# Patient Record
Sex: Female | Born: 1955
Health system: Southern US, Community
[De-identification: ages and names within clinical notes are randomized; demographics above are authoritative.]

## PROBLEM LIST (undated history)

## (undated) DIAGNOSIS — I10 Essential (primary) hypertension: Secondary | ICD-10-CM

## (undated) DIAGNOSIS — J45909 Unspecified asthma, uncomplicated: Secondary | ICD-10-CM

## (undated) DIAGNOSIS — D219 Benign neoplasm of connective and other soft tissue, unspecified: Secondary | ICD-10-CM

## (undated) DIAGNOSIS — E119 Type 2 diabetes mellitus without complications: Secondary | ICD-10-CM

## (undated) HISTORY — DX: Essential (primary) hypertension: I10

## (undated) HISTORY — DX: Benign neoplasm of connective and other soft tissue, unspecified: D21.9

## (undated) HISTORY — DX: Type 2 diabetes mellitus without complications: E11.9

## (undated) HISTORY — DX: Unspecified asthma, uncomplicated: J45.909

## (undated) HISTORY — PX: MYOMECTOMY: SHX85

---

## 2000-03-13 ENCOUNTER — Other Ambulatory Visit: Admission: RE | Admit: 2000-03-13 | Discharge: 2000-03-13 | Payer: Self-pay | Admitting: Obstetrics and Gynecology

## 2001-03-13 ENCOUNTER — Other Ambulatory Visit: Admission: RE | Admit: 2001-03-13 | Discharge: 2001-03-13 | Payer: Self-pay | Admitting: Obstetrics and Gynecology

## 2002-05-13 ENCOUNTER — Other Ambulatory Visit: Admission: RE | Admit: 2002-05-13 | Discharge: 2002-05-13 | Payer: Self-pay | Admitting: Obstetrics and Gynecology

## 2003-05-21 ENCOUNTER — Other Ambulatory Visit: Admission: RE | Admit: 2003-05-21 | Discharge: 2003-05-21 | Payer: Self-pay | Admitting: Obstetrics and Gynecology

## 2004-05-19 ENCOUNTER — Other Ambulatory Visit: Admission: RE | Admit: 2004-05-19 | Discharge: 2004-05-19 | Payer: Self-pay | Admitting: Obstetrics and Gynecology

## 2005-05-23 ENCOUNTER — Other Ambulatory Visit: Admission: RE | Admit: 2005-05-23 | Discharge: 2005-05-23 | Payer: Self-pay | Admitting: Obstetrics and Gynecology

## 2011-06-22 ENCOUNTER — Other Ambulatory Visit: Payer: 59

## 2011-06-22 ENCOUNTER — Encounter (INDEPENDENT_AMBULATORY_CARE_PROVIDER_SITE_OTHER): Payer: 59 | Admitting: Obstetrics and Gynecology

## 2011-06-22 DIAGNOSIS — D259 Leiomyoma of uterus, unspecified: Secondary | ICD-10-CM

## 2011-06-22 DIAGNOSIS — N949 Unspecified condition associated with female genital organs and menstrual cycle: Secondary | ICD-10-CM

## 2012-05-06 ENCOUNTER — Ambulatory Visit (INDEPENDENT_AMBULATORY_CARE_PROVIDER_SITE_OTHER): Payer: BC Managed Care – PPO | Admitting: Obstetrics and Gynecology

## 2012-05-06 ENCOUNTER — Encounter: Payer: Self-pay | Admitting: Obstetrics and Gynecology

## 2012-05-06 VITALS — BP 120/78 | Resp 16 | Ht 66.0 in | Wt 291.0 lb

## 2012-05-06 DIAGNOSIS — Z331 Pregnant state, incidental: Secondary | ICD-10-CM

## 2012-05-06 DIAGNOSIS — Z124 Encounter for screening for malignant neoplasm of cervix: Secondary | ICD-10-CM

## 2012-05-06 NOTE — Progress Notes (Signed)
Subjective:    Morgan Vincent is a 57 y.o. female No obstetric history on file. who presents for annual exam. The patient planes of urinary urgency and worsening incontinence.  The following portions of the patient's history were reviewed and updated as appropriate: allergies, current medications, past family history, past medical history, past social history, past surgical history and problem list.  Review of Systems Pertinent items are noted in HPI. Gastrointestinal:No change in bowel habits, no abdominal pain, no rectal bleeding Genitourinary:negative for dysuria, frequency, hematuria, nocturia and urinary incontinence    Objective:     BP 120/78  Resp 16  Ht 5\' 6"  (1.676 m)  Wt 291 lb (131.997 kg)  BMI 46.97 kg/m2  Weight:  Wt Readings from Last 1 Encounters:  05/06/12 291 lb (131.997 kg)     BMI: Body mass index is 46.97 kg/(m^2). General Appearance: Alert, appropriate appearance for age. No acute distress HEENT: Grossly normal Neck / Thyroid: Supple, no masses, nodes or enlargement Lungs: clear to auscultation bilaterally Back: No CVA tenderness Breast Exam: No masses or nodes.No dimpling, nipple retraction or discharge. Cardiovascular: Regular rate and rhythm. S1, S2, no murmur Gastrointestinal: Soft, non-tender, no masses or organomegaly  ++++++++++++++++++++++++++++++++++++++++++++++++++++++++  Pelvic Exam: External genitalia: normal general appearance Vaginal: normal without tenderness, induration or masses Cervix: nontender Adnexa: normal bimanual exam Uterus: seems normal but difficult to outline Rectovaginal: normal rectal, no masses  ++++++++++++++++++++++++++++++++++++++++++++++++++++++++  Lymphatic Exam: Non-palpable nodes in neck, clavicular, axillary, or inguinal regions  Psychiatric: Alert and oriented, appropriate affect.      Assessment:    Normal gyn exam   Overweight or obese: Yes  Pelvic relaxation: No  Menopausal symptoms: No. Severe:  No.  Urinary incontinence   Plan:    Mammogram. Pap smear.   Follow-up:  for annual exam  The updated Pap smear screening guidelines were discussed with the patient. The patient requested that I obtain a Pap smear: Yes.  Kegel exercises discussed: Yes.  Proper diet and regular exercise were reviewed.  Annual mammograms recommended starting at age 93. Proper breast care was discussed.  Screening colonoscopy is recommended beginning at age 75.  Regular health maintenance was reviewed.  Sleep hygiene was discussed.  Adequate calcium and vitamin D intake was emphasized.  Leonard Schwartz M.D.   Regular Periods: no Mammogram: no  Monthly Breast Ex.: no Exercise: no  Tetanus < 10 years: no Seatbelts: yes  NI. Bladder Functn.: no "Frequency"  Abuse at home: no  Daily BM's: yes Stressful Work: yes  Healthy Diet: no Sigmoid-Colonoscopy: 2007  Calcium: yes Medical problems this year: Hard Stools.    LAST PAP:05/24/2011 "WNL" pt has new INS.   Contraception: None  Mammogram:  06/13/2011 "WNL"  PCP: Dr. Dorena Cookey  PMH: No changes  FMH: No Changes  Last Bone Scan: WNL pt unsure when

## 2012-05-07 LAB — PAP IG W/ RFLX HPV ASCU

## 2017-09-27 ENCOUNTER — Encounter: Payer: Self-pay | Admitting: Gynecologic Oncology

## 2017-10-03 ENCOUNTER — Encounter: Payer: Self-pay | Admitting: Gynecologic Oncology

## 2017-10-03 ENCOUNTER — Telehealth: Payer: Self-pay | Admitting: *Deleted

## 2017-10-03 ENCOUNTER — Inpatient Hospital Stay: Payer: Managed Care, Other (non HMO) | Attending: Gynecologic Oncology | Admitting: Gynecologic Oncology

## 2017-10-03 VITALS — BP 143/56 | HR 99 | Temp 98.3°F | Resp 18 | Ht 65.5 in | Wt 269.4 lb

## 2017-10-03 DIAGNOSIS — N9489 Other specified conditions associated with female genital organs and menstrual cycle: Secondary | ICD-10-CM

## 2017-10-03 DIAGNOSIS — C541 Malignant neoplasm of endometrium: Secondary | ICD-10-CM | POA: Diagnosis not present

## 2017-10-03 MED ORDER — MEGESTROL ACETATE 40 MG PO TABS: 40.0000 mg | ORAL_TABLET | Freq: Two times a day (BID) | ORAL | 0 refills | Status: DC

## 2017-10-03 NOTE — Patient Instructions (Addendum)
Preparing for your Surgery  Plan for surgery on October 15, 2017 with Dr. Everitt Amber at [place] will be scheduled for a robotic assisted total hysterectomy, bilateral salpingo-oophorectomy, sentinel lymph node biopsy, mini laparotomy.  STOP TAKING ASPIRIN NOW  Begin taking Megace 40 mg twice daily.  Pre-operative Testing -You will receive a phone call from presurgical testing at [facility] to arrange for a pre-operative testing appointment before your surgery.  This appointment normally occurs one to two weeks before your scheduled surgery.   -Bring your insurance card, copy of an advanced directive if applicable, medication list  -At that visit, you will be asked to sign a consent for a possible blood transfusion in case a transfusion becomes necessary during surgery.  The need for a blood transfusion is rare but having consent is a necessary part of your care.     -You should not be taking blood thinners or aspirin at least ten days prior to surgery unless instructed by your surgeon.  Day Before Surgery at [place] will be asked to take in a light diet the day before surgery.  Avoid carbonated beverages.  You will be advised to have nothing to eat or drink after midnight the evening before.    Eat a light diet the day before surgery.  Examples including soups, broths, toast, yogurt, mashed potatoes.  Things to avoid include carbonated beverages (fizzy beverages), raw fruits and raw vegetables, or beans.   If your bowels are filled with gas, your surgeon will have difficulty visualizing your pelvic organs which increases your surgical risks.  Your role in recovery Your role is to become active as soon as directed by your doctor, while still giving yourself time to heal.  Rest when you feel tired. You will be asked to do the following in order to speed your recovery:  - Cough and breathe deeply. This helps toclear and expand your lungs and can  prevent pneumonia. You may be given a spirometer to practice deep breathing. A staff member will show you how to use the spirometer. - Do mild physical activity. Walking or moving your legs help your circulation and body functions return to normal. A staff member will help you when you try to walk and will provide you with simple exercises. Do not try to get up or walk alone the first time. - Actively manage your pain. Managing your pain lets you move in comfort. We will ask you to rate your pain on a scale of zero to 10. It is your responsibility to tell your doctor or nurse where and how much you hurt so your pain can be treated.  Special Considerations -If you are diabetic, you may be placed on insulin after surgery to have closer control over your blood sugars to promote healing and recovery.  This does not mean that you will be discharged on insulin.  If applicable, your oral antidiabetics will be resumed when you are tolerating a solid diet.  -Your final pathology results from surgery should be available by the Friday after surgery and the results will be relayed to you when available.  -Dr. Lahoma Crocker is the Surgeon that assists your GYN Oncologist with surgery.  The next day after your surgery you will either see your GYN Oncologist or Dr. Lahoma Crocker.   Blood Transfusion Information WHAT IS A BLOOD TRANSFUSION? A transfusion is the replacement of blood or some of its parts. Blood is made up of multiple cells  which provide different functions.  Red blood cells carry oxygen and are used for blood loss replacement.  White blood cells fight against infection.  Platelets control bleeding.  Plasma helps clot blood.  Other blood products are available for specialized needs, such as hemophilia or other clotting disorders. BEFORE THE TRANSFUSION  Who gives blood for transfusions?   You may be able to donate blood to be used at a later date on yourself (autologous  donation).  Relatives can be asked to donate blood. This is generally not any safer than if you have received blood from a stranger. The same precautions are taken to ensure safety when a relative's blood is donated.  Healthy volunteers who are fully evaluated to make sure their blood is safe. This is blood bank blood. Transfusion therapy is the safest it has ever been in the practice of medicine. Before blood is taken from a donor, a complete history is taken to make sure that person has no history of diseases nor engages in risky social behavior (examples are intravenous drug use or sexual activity with multiple partners). The donor's travel history is screened to minimize risk of transmitting infections, such as malaria. The donated blood is tested for signs of infectious diseases, such as HIV and hepatitis. The blood is then tested to be sure it is compatible with you in order to minimize the chance of a transfusion reaction. If you or a relative donates blood, this is often done in anticipation of surgery and is not appropriate for emergency situations. It takes many days to process the donated blood. RISKS AND COMPLICATIONS Although transfusion therapy is very safe and saves many lives, the main dangers of transfusion include:   Getting an infectious disease.  Developing a transfusion reaction. This is an allergic reaction to something in the blood you were given. Every precaution is taken to prevent this. The decision to have a blood transfusion has been considered carefully by your caregiver before blood is given. Blood is not given unless the benefits outweigh the risks.

## 2017-10-03 NOTE — Telephone Encounter (Signed)
Faxed medical request/release form to [facility] for the CT scan to be deliver via [employer]

## 2017-10-03 NOTE — H&P (View-Only) (Signed)
Consult Note: Gyn-Onc  Consult was requested by Dr. Raphael Gibney for the evaluation of Morgan Vincent 62 y.o. female  CC:  Chief Complaint  Patient presents with  . Endometrial mass  . endometrial cancer    Assessment/Plan:  Ms. ESTEFANIE CORNFORTH  is a 62 y.o.  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   A detailed discussion was held with the patient and her family with regard to to her endometrial cancer diagnosis. We discussed the standard management options for uterine cancer which includes surgery followed possibly by adjuvant therapy depending on the results of surgery. The options for surgical management include a hysterectomy and removal of the tubes and ovaries possibly with removal of pelvic and para-aortic lymph nodes.If feasible, a minimally invasive approach including a robotic hysterectomy or laparoscopic hysterectomy have benefits including shorter hospital stay, recovery time and better wound healing than with open surgery. The patient has been counseled about these surgical options and the risks of surgery in general including infection, bleeding, damage to surrounding structures (including bowel, bladder, ureters, nerves or vessels), and the postoperative risks of PE/ DVT, and lymphedema. I extensively reviewed the additional risks of robotic hysterectomy including possible need for conversion to open laparotomy.  I discussed positioning during surgery of trendelenberg and risks of minor facial swelling and care we take in preoperative positioning.  After counseling and consideration of her options, she desires to proceed with robotic assisted total hysterectomy with bilateral sapingo-oophorectomy and SLN biopsy.   She will be seen by anesthesia for preoperative clearance and discussion of postoperative pain management.  She was given the opportunity to ask questions, which were answered to her satisfaction, and she is agreement with the  above mentioned plan of care.  I discussed that due to the apparent high grade nature of her lesion, postop adjuvant therapy will likely be recommended.   We will obtain the CT images from [facility] to better evaluate the size of the uterus. If the uterus is enlarged, minilaparotomy for specimen delivery may be necessary.   She requested treatment for the bleeding (we will give megace 40BID until surgery). Will check HbA1c preop.    HPI: Morgan Vincent is a 62 year old woman who is seen in consultation at the request of Dr Raphael Gibney for a high grade endometrial cancer.  Patient developed severe abdominal pains in the abdomen on Sep 18, 2017.  This became worse overnight and she presented to [facility] emergency room department on Sep 19, 2017.  Her pains were crampy and throughout the upper and lower abdomen.  A CT scan of the abdomen and pelvis was performed which revealed mild inflammation involving distal small bowel loops including the terminal ileum with no obstruction, diverticulosis without definite diverticulitis.  No abscess.  The uterus was markedly abnormal with large heterogeneous soft tissues within the endometrial cavity.  There are multiple calcified fibroids identified.  She was presumed to have a diagnosis of gastroenteritis and was discharged home with recommendation to follow-up with her OB/GYN provider for work-up of her abnormal uterine findings.  She saw Dr. Raphael Gibney on May 31st, 2019 and a Pap smear was obtained which revealed atypical glandular cells, HPV negative.  An endometrial biopsy was performed which revealed minute fragments of malignant neoplasm, favor high-grade carcinoma.  There is small amount of of tissue was too small for further evaluation with immunohistochemistry.  I recommendation to correlate with clinical findings is suggested.  The patient is obese, she has a history of hypertension, and asthma.  She has had a prior laparoscopic fibroid surgery (possible  myomectomy).  She is nulliparous.  She had no other abdominal surgeries.  Her last menstrual period was in her 70s and she has had no postmenopausal bleeding until she had a biopsy which diagnosed her endometrial cancer.  Her family history significant for father who died of liver cancer, a paternal uncle who died of cancer of unknown origin, and a maternal aunt who died of cancer of unknown origin.  The patient has been diagnosed with diabetes, however she is not treated with medication for this.  Her last colonoscopy was 2 3 years ago.  She had a normal Pap smear in 2017.  Current Meds:  Outpatient Encounter Medications as of 10/03/2017  Medication Sig  . ALBUTEROL IN Inhale into the lungs. As needed  . ASCORBIC ACID PO Take by mouth.  . Cholecalciferol (VITAMIN D3) 1000 units CAPS Take by mouth.  . dicyclomine (BENTYL) 20 MG tablet 1 tab every 6 hours for 3 days.  . hydrochlorothiazide (HYDRODIURIL) 50 MG tablet hydrochlorothiazide 50 mg tablet  . potassium chloride (MICRO-K) 10 MEQ CR capsule Take by mouth.  Marland Kitchen PROBIOTIC PRODUCT PO Take by mouth.  Marland Kitchen aspirin 81 MG tablet Take 81 mg by mouth daily.  . megestrol (MEGACE) 40 MG tablet Take 1 tablet (40 mg total) by mouth 2 (two) times daily.   No facility-administered encounter medications on file as of 10/03/2017.     Allergy: No Known Allergies  Social Hx:   Social History   Socioeconomic History  . Marital status: Single    Spouse name: Not on file  . Number of children: Not on file  . Years of education: Not on file  . Highest education level: Not on file  Occupational History  . Not on file  Social Needs  . Financial resource strain: Not on file  . Food insecurity:    Worry: Not on file    Inability: Not on file  . Transportation needs:    Medical: Not on file    Non-medical: Not on file  Tobacco Use  . Smoking status: Never Smoker  . Smokeless tobacco: Never Used  Substance and Sexual Activity  . Alcohol use: No  .  Drug use: No  . Sexual activity: Not Currently    Partners: Male    Birth control/protection: None  Lifestyle  . Physical activity:    Days per week: Not on file    Minutes per session: Not on file  . Stress: Not on file  Relationships  . Social connections:    Talks on phone: Not on file    Gets together: Not on file    Attends religious service: Not on file    Active member of club or organization: Not on file    Attends meetings of clubs or organizations: Not on file    Relationship status: Not on file  . Intimate partner violence:    Fear of current or ex partner: Not on file    Emotionally abused: Not on file    Physically abused: Not on file    Forced sexual activity: Not on file  Other Topics Concern  . Not on file  Social History Narrative  . Not on file    Past Surgical Hx: History reviewed. No pertinent surgical history.  Past Medical Hx:  Past Medical History:  Diagnosis Date  . Asthma   .  Diabetes mellitus without complication ([place])   . Fibroids   . Hypertension     Past Gynecological History:  No history of abnormal paps (last normal pap was 2017). G0, history of fibroids. No LMP recorded. Patient is postmenopausal.  Family Hx:  Family History  Problem Relation Age of Onset  . Alzheimer's disease Mother   . Liver cancer Father   . Cancer Maternal Aunt     Review of Systems:  Constitutional  Feels well,    ENT Normal appearing ears and nares bilaterally Skin/Breast  No rash, sores, jaundice, itching, dryness Cardiovascular  No chest pain, shortness of breath, or edema  Pulmonary  No cough or wheeze.  Gastro Intestinal  No nausea, vomitting, or diarrhoea. No bright red blood per rectum, no abdominal pain, change in bowel movement, or constipation.  Genito Urinary  No frequency, urgency, dysuria, + postmenopausal bleeding Musculo Skeletal  No myalgia, arthralgia, joint swelling or pain  Neurologic  No weakness, numbness, change in gait,   Psychology  No depression, anxiety, insomnia.   Vitals:  Blood pressure (!) 143/56, pulse 99, temperature 98.3 F (36.8 C), temperature source Oral, resp. rate 18, height 5' 5.5" (1.664 m), weight 269 lb 6 oz (122.2 kg), SpO2 100 %.  Physical Exam: WD in NAD Neck  Supple NROM, without any enlargements.  Lymph Node Survey No cervical supraclavicular or inguinal adenopathy Cardiovascular  Pulse normal rate, regularity and rhythm. S1 and S2 normal.  Lungs  Clear to auscultation bilateraly, without wheezes/crackles/rhonchi. Good air movement.  Skin  No rash/lesions/breakdown  Psychiatry  Alert and oriented to person, place, and time  Abdomen  Normoactive bowel sounds, abdomen soft, non-tender and obese without evidence of hernia.  Back No CVA tenderness Genito Urinary  Vulva/vagina: Normal external female genitalia.   No lesions. No discharge or bleeding.  Bladder/urethra:  No lesions or masses, well supported bladder  Vagina: normal, blood in vagina  Cervix: Normal appearing, no lesions.  Uterus: 10+ weeks, irregular, mobile, no parametrial involvement or nodularity.  Adnexa: no palpable masses. Rectal  deferred  Extremities  No bilateral cyanosis, clubbing or edema.   Thereasa Solo, MD  10/03/2017, 5:08 PM

## 2017-10-03 NOTE — Progress Notes (Signed)
Consult Note: Gyn-Onc  Consult was requested by Dr. Raphael Gibney for the evaluation of Morgan Vincent 62 y.o. female  CC:  Chief Complaint  Patient presents with  . Endometrial mass  . endometrial cancer    Assessment/Plan:  Ms. SETAREH ROM  is a 62 y.o.  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   A detailed discussion was held with the patient and her family with regard to to her endometrial cancer diagnosis. We discussed the standard management options for uterine cancer which includes surgery followed possibly by adjuvant therapy depending on the results of surgery. The options for surgical management include a hysterectomy and removal of the tubes and ovaries possibly with removal of pelvic and para-aortic lymph nodes.If feasible, a minimally invasive approach including a robotic hysterectomy or laparoscopic hysterectomy have benefits including shorter hospital stay, recovery time and better wound healing than with open surgery. The patient has been counseled about these surgical options and the risks of surgery in general including infection, bleeding, damage to surrounding structures (including bowel, bladder, ureters, nerves or vessels), and the postoperative risks of PE/ DVT, and lymphedema. I extensively reviewed the additional risks of robotic hysterectomy including possible need for conversion to open laparotomy.  I discussed positioning during surgery of trendelenberg and risks of minor facial swelling and care we take in preoperative positioning.  After counseling and consideration of her options, she desires to proceed with robotic assisted total hysterectomy with bilateral sapingo-oophorectomy and SLN biopsy.   She will be seen by anesthesia for preoperative clearance and discussion of postoperative pain management.  She was given the opportunity to ask questions, which were answered to her satisfaction, and she is agreement with the  above mentioned plan of care.  I discussed that due to the apparent high grade nature of her lesion, postop adjuvant therapy will likely be recommended.   We will obtain the CT images from [facility] to better evaluate the size of the uterus. If the uterus is enlarged, minilaparotomy for specimen delivery may be necessary.   She requested treatment for the bleeding (we will give megace 40BID until surgery). Will check HbA1c preop.    HPI: Morgan Vincent is a 62 year old woman who is seen in consultation at the request of Dr Raphael Gibney for a high grade endometrial cancer.  Patient developed severe abdominal pains in the abdomen on Sep 18, 2017.  This became worse overnight and she presented to [facility] emergency room department on Sep 19, 2017.  Her pains were crampy and throughout the upper and lower abdomen.  A CT scan of the abdomen and pelvis was performed which revealed mild inflammation involving distal small bowel loops including the terminal ileum with no obstruction, diverticulosis without definite diverticulitis.  No abscess.  The uterus was markedly abnormal with large heterogeneous soft tissues within the endometrial cavity.  There are multiple calcified fibroids identified.  She was presumed to have a diagnosis of gastroenteritis and was discharged home with recommendation to follow-up with her OB/GYN provider for work-up of her abnormal uterine findings.  She saw Dr. Raphael Gibney on May 31st, 2019 and a Pap smear was obtained which revealed atypical glandular cells, HPV negative.  An endometrial biopsy was performed which revealed minute fragments of malignant neoplasm, favor high-grade carcinoma.  There is small amount of of tissue was too small for further evaluation with immunohistochemistry.  I recommendation to correlate with clinical findings is suggested.  The patient is obese, she has a history of hypertension, and asthma.  She has had a prior laparoscopic fibroid surgery (possible  myomectomy).  She is nulliparous.  She had no other abdominal surgeries.  Her last menstrual period was in her 38s and she has had no postmenopausal bleeding until she had a biopsy which diagnosed her endometrial cancer.  Her family history significant for father who died of liver cancer, a paternal uncle who died of cancer of unknown origin, and a maternal aunt who died of cancer of unknown origin.  The patient has been diagnosed with diabetes, however she is not treated with medication for this.  Her last colonoscopy was 2 3 years ago.  She had a normal Pap smear in 2017.  Current Meds:  Outpatient Encounter Medications as of 10/03/2017  Medication Sig  . ALBUTEROL IN Inhale into the lungs. As needed  . ASCORBIC ACID PO Take by mouth.  . Cholecalciferol (VITAMIN D3) 1000 units CAPS Take by mouth.  . dicyclomine (BENTYL) 20 MG tablet 1 tab every 6 hours for 3 days.  . hydrochlorothiazide (HYDRODIURIL) 50 MG tablet hydrochlorothiazide 50 mg tablet  . potassium chloride (MICRO-K) 10 MEQ CR capsule Take by mouth.  Marland Kitchen PROBIOTIC PRODUCT PO Take by mouth.  Marland Kitchen aspirin 81 MG tablet Take 81 mg by mouth daily.  . megestrol (MEGACE) 40 MG tablet Take 1 tablet (40 mg total) by mouth 2 (two) times daily.   No facility-administered encounter medications on file as of 10/03/2017.     Allergy: No Known Allergies  Social Hx:   Social History   Socioeconomic History  . Marital status: Single    Spouse name: Not on file  . Number of children: Not on file  . Years of education: Not on file  . Highest education level: Not on file  Occupational History  . Not on file  Social Needs  . Financial resource strain: Not on file  . Food insecurity:    Worry: Not on file    Inability: Not on file  . Transportation needs:    Medical: Not on file    Non-medical: Not on file  Tobacco Use  . Smoking status: Never Smoker  . Smokeless tobacco: Never Used  Substance and Sexual Activity  . Alcohol use: No  .  Drug use: No  . Sexual activity: Not Currently    Partners: Male    Birth control/protection: None  Lifestyle  . Physical activity:    Days per week: Not on file    Minutes per session: Not on file  . Stress: Not on file  Relationships  . Social connections:    Talks on phone: Not on file    Gets together: Not on file    Attends religious service: Not on file    Active member of club or organization: Not on file    Attends meetings of clubs or organizations: Not on file    Relationship status: Not on file  . Intimate partner violence:    Fear of current or ex partner: Not on file    Emotionally abused: Not on file    Physically abused: Not on file    Forced sexual activity: Not on file  Other Topics Concern  . Not on file  Social History Narrative  . Not on file    Past Surgical Hx: History reviewed. No pertinent surgical history.  Past Medical Hx:  Past Medical History:  Diagnosis Date  . Asthma   .  Diabetes mellitus without complication ([place])   . Fibroids   . Hypertension     Past Gynecological History:  No history of abnormal paps (last normal pap was 2017). G0, history of fibroids. No LMP recorded. Patient is postmenopausal.  Family Hx:  Family History  Problem Relation Age of Onset  . Alzheimer's disease Mother   . Liver cancer Father   . Cancer Maternal Aunt     Review of Systems:  Constitutional  Feels well,    ENT Normal appearing ears and nares bilaterally Skin/Breast  No rash, sores, jaundice, itching, dryness Cardiovascular  No chest pain, shortness of breath, or edema  Pulmonary  No cough or wheeze.  Gastro Intestinal  No nausea, vomitting, or diarrhoea. No bright red blood per rectum, no abdominal pain, change in bowel movement, or constipation.  Genito Urinary  No frequency, urgency, dysuria, + postmenopausal bleeding Musculo Skeletal  No myalgia, arthralgia, joint swelling or pain  Neurologic  No weakness, numbness, change in gait,   Psychology  No depression, anxiety, insomnia.   Vitals:  Blood pressure (!) 143/56, pulse 99, temperature 98.3 F (36.8 C), temperature source Oral, resp. rate 18, height 5' 5.5" (1.664 m), weight 269 lb 6 oz (122.2 kg), SpO2 100 %.  Physical Exam: WD in NAD Neck  Supple NROM, without any enlargements.  Lymph Node Survey No cervical supraclavicular or inguinal adenopathy Cardiovascular  Pulse normal rate, regularity and rhythm. S1 and S2 normal.  Lungs  Clear to auscultation bilateraly, without wheezes/crackles/rhonchi. Good air movement.  Skin  No rash/lesions/breakdown  Psychiatry  Alert and oriented to person, place, and time  Abdomen  Normoactive bowel sounds, abdomen soft, non-tender and obese without evidence of hernia.  Back No CVA tenderness Genito Urinary  Vulva/vagina: Normal external female genitalia.   No lesions. No discharge or bleeding.  Bladder/urethra:  No lesions or masses, well supported bladder  Vagina: normal, blood in vagina  Cervix: Normal appearing, no lesions.  Uterus: 10+ weeks, irregular, mobile, no parametrial involvement or nodularity.  Adnexa: no palpable masses. Rectal  deferred  Extremities  No bilateral cyanosis, clubbing or edema.   Thereasa Solo, MD  10/03/2017, 5:08 PM

## 2017-10-05 ENCOUNTER — Other Ambulatory Visit: Payer: Self-pay | Admitting: Gynecologic Oncology

## 2017-10-05 ENCOUNTER — Inpatient Hospital Stay
Admission: RE | Admit: 2017-10-05 | Discharge: 2017-10-05 | Disposition: A | Discharge status: Home / Self Care | Payer: Self-pay | Source: Ambulatory Visit | Attending: Gynecologic Oncology | Admitting: Gynecologic Oncology

## 2017-10-05 DIAGNOSIS — C801 Malignant (primary) neoplasm, unspecified: Secondary | ICD-10-CM

## 2017-10-09 NOTE — Patient Instructions (Addendum)
Morgan Vincent  10/09/2017   Your procedure is scheduled on: 10-15-17   Report to [facility] Main  Entrance    Report to Admitting at 11:30 AM   Eat a light diet the day before surgery.  Examples including soups, broths, toast, yogurt, mashed potatoes.  Things to avoid include carbonated beverages (fizzy beverages), raw fruits and raw vegetables, or beans.   If your bowels are filled with gas, your surgeon will have difficulty visualizing your pelvic organs which increases your surgical risks.    Call this number if you have problems the morning of surgery [phone]   Remember: Do not eat food or drink liquids :After Midnight.You may have a Clear Liquid Diet from Midnight until 8:00 AM. After 8:00 AM, nothing until after surgery.    CLEAR LIQUID DIET   Foods Allowed                                                                     Foods Excluded  Coffee and tea, regular and decaf                             liquids that you cannot  Plain Jell-O in any flavor                                             see through such as: Fruit ices (not with fruit pulp)                                     milk, soups, orange juice  Iced Popsicles                                    All solid food Carbonated beverages, regular and diet                                    Cranberry, grape and apple juices Sports drinks like Gatorade Lightly seasoned clear broth or consume(fat free) Sugar, honey syrup  Sample Menu Breakfast                                Lunch                                     Supper Cranberry juice                    Beef broth                            Chicken broth Jell-O  Grape juice                           Apple juice Coffee or tea                        Jell-O                                      Popsicle                                                Coffee or tea                        Coffee or  tea  _____________________________________________________________________     Take these medicines the morning of surgery with A SIP OF WATER: None                                 You may not have any metal on your body including hair pins and              piercings  Do not wear jewelry, make-up, lotions, powders or perfumes, deodorant             Do not wear nail polish.  Do not shave  48 hours prior to surgery.                Do not bring valuables to the hospital. [place].  Contacts, dentures or bridgework may not be worn into surgery.  Leave suitcase in the car. After surgery it may be brought to your room.    Special Instructions: Cough and Deep Breath Exercises              Please read over the following fact sheets you were given: _____________________________________________________________________          [facility] - Preparing for Surgery Before surgery, you can play an important role.  Because skin is not sterile, your skin needs to be as free of germs as possible.  You can reduce the number of germs on your skin by washing with CHG (chlorahexidine gluconate) soap before surgery.  CHG is an antiseptic cleaner which kills germs and bonds with the skin to continue killing germs even after washing. Please DO NOT use if you have an allergy to CHG or antibacterial soaps.  If your skin becomes reddened/irritated stop using the CHG and inform your nurse when you arrive at Short Stay. Do not shave (including legs and underarms) for at least 48 hours prior to the first CHG shower.  You may shave your face/neck. Please follow these instructions carefully:  1.  Shower with CHG Soap the night before surgery and the  morning of Surgery.  2.  If you choose to wash your hair, wash your hair first as usual with your  normal  shampoo.  3.  After you shampoo, rinse your hair and body thoroughly to remove the  shampoo.  4.  Use CHG as you would any other liquid soap.  You can apply chg directly  to the skin and wash                       Gently with a scrungie or clean washcloth.  5.  Apply the CHG Soap to your body ONLY FROM THE NECK DOWN.   Do not use on face/ open                           Wound or open sores. Avoid contact with eyes, ears mouth and genitals (private parts).                       Wash face,  Genitals (private parts) with your normal soap.             6.  Wash thoroughly, paying special attention to the area where your surgery  will be performed.  7.  Thoroughly rinse your body with warm water from the neck down.  8.  DO NOT shower/wash with your normal soap after using and rinsing off  the CHG Soap.                9.  Pat yourself dry with a clean towel.            10.  Wear clean pajamas.            11.  Place clean sheets on your bed the night of your first shower and do not  sleep with pets. Day of Surgery : Do not apply any lotions/deodorants the morning of surgery.  Please wear clean clothes to the hospital/surgery center.  FAILURE TO FOLLOW THESE INSTRUCTIONS MAY RESULT IN THE CANCELLATION OF YOUR SURGERY PATIENT SIGNATURE_________________________________  NURSE SIGNATURE__________________________________  ________________________________________________________________________   Adam Phenix  An incentive spirometer is a tool that can help keep your lungs clear and active. This tool measures how well you are filling your lungs with each breath. Taking long deep breaths may help reverse or decrease the chance of developing breathing (pulmonary) problems (especially infection) following:  A long period of time when you are unable to move or be active. BEFORE THE PROCEDURE   If the spirometer includes an indicator to show your best effort, your nurse or respiratory therapist will set it to a desired goal.  If possible, sit up straight or lean slightly forward. Try  not to slouch.  Hold the incentive spirometer in an upright position. INSTRUCTIONS FOR USE  1. Sit on the edge of your bed if possible, or sit up as far as you can in bed or on a chair. 2. Hold the incentive spirometer in an upright position. 3. Breathe out normally. 4. Place the mouthpiece in your mouth and seal your lips tightly around it. 5. Breathe in slowly and as deeply as possible, raising the piston or the ball toward the top of the column. 6. Hold your breath for 3-5 seconds or for as long as possible. Allow the piston or ball to fall to the bottom of the column. 7. Remove the mouthpiece from your mouth and breathe out normally. 8. Rest for a few seconds and repeat Steps 1 through 7 at least 10 times every 1-2 hours when you are awake. Take your time and take a few normal breaths between deep breaths. 9. The spirometer may include an indicator to show  your best effort. Use the indicator as a goal to work toward during each repetition. 10. After each set of 10 deep breaths, practice coughing to be sure your lungs are clear. If you have an incision (the cut made at the time of surgery), support your incision when coughing by placing a pillow or rolled up towels firmly against it. Once you are able to get out of bed, walk around indoors and cough well. You may stop using the incentive spirometer when instructed by your caregiver.  RISKS AND COMPLICATIONS  Take your time so you do not get dizzy or light-headed.  If you are in pain, you may need to take or ask for pain medication before doing incentive spirometry. It is harder to take a deep breath if you are having pain. AFTER USE  Rest and breathe slowly and easily.  It can be helpful to keep track of a log of your progress. Your caregiver can provide you with a simple table to help with this. If you are using the spirometer at home, follow these instructions: [place] IF:   You are having difficultly using the  spirometer.  You have trouble using the spirometer as often as instructed.  Your pain medication is not giving enough relief while using the spirometer.  You develop fever of 100.5 F (38.1 C) or higher. SEEK IMMEDIATE MEDICAL CARE IF:   You cough up bloody sputum that had not been present before.  You develop fever of 102 F (38.9 C) or greater.  You develop worsening pain at or near the incision site. MAKE SURE YOU:   Understand these instructions.  Will watch your condition.  Will get help right away if you are not doing well or get worse. Document Released: 08/21/2006 Document Revised: 07/03/2011 Document Reviewed: 10/22/2006 ExitCare Patient Information 2014 ExitCare, [place].   ________________________________________________________________________  WHAT IS A BLOOD TRANSFUSION? Blood Transfusion Information  A transfusion is the replacement of blood or some of its parts. Blood is made up of multiple cells which provide different functions.  Red blood cells carry oxygen and are used for blood loss replacement.  White blood cells fight against infection.  Platelets control bleeding.  Plasma helps clot blood.  Other blood products are available for specialized needs, such as hemophilia or other clotting disorders. BEFORE THE TRANSFUSION  Who gives blood for transfusions?   Healthy volunteers who are fully evaluated to make sure their blood is safe. This is blood bank blood. Transfusion therapy is the safest it has ever been in the practice of medicine. Before blood is taken from a donor, a complete history is taken to make sure that person has no history of diseases nor engages in risky social behavior (examples are intravenous drug use or sexual activity with multiple partners). The donor's travel history is screened to minimize risk of transmitting infections, such as malaria. The donated blood is tested for signs of infectious diseases, such as HIV and hepatitis.  The blood is then tested to be sure it is compatible with you in order to minimize the chance of a transfusion reaction. If you or a relative donates blood, this is often done in anticipation of surgery and is not appropriate for emergency situations. It takes many days to process the donated blood. RISKS AND COMPLICATIONS Although transfusion therapy is very safe and saves many lives, the main dangers of transfusion include:   Getting an infectious disease.  Developing a transfusion reaction. This is an allergic reaction to  something in the blood you were given. Every precaution is taken to prevent this. The decision to have a blood transfusion has been considered carefully by your caregiver before blood is given. Blood is not given unless the benefits outweigh the risks. AFTER THE TRANSFUSION  Right after receiving a blood transfusion, you will usually feel much better and more energetic. This is especially true if your red blood cells have gotten low (anemic). The transfusion raises the level of the red blood cells which carry oxygen, and this usually causes an energy increase.  The nurse administering the transfusion will monitor you carefully for complications. HOME CARE INSTRUCTIONS  No special instructions are needed after a transfusion. You may find your energy is better. Speak with your caregiver about any limitations on activity for underlying diseases you may have. SEEK MEDICAL CARE IF:   Your condition is not improving after your transfusion.  You develop redness or irritation at the intravenous (IV) site. SEEK IMMEDIATE MEDICAL CARE IF:  Any of the following symptoms occur over the next 12 hours:  Shaking chills.  You have a temperature by mouth above 102 F (38.9 C), not controlled by medicine.  Chest, back, or muscle pain.  People around you feel you are not acting correctly or are confused.  Shortness of breath or difficulty breathing.  Dizziness and fainting.  You  get a rash or develop hives.  You have a decrease in urine output.  Your urine turns a dark color or changes to pink, red, or brown. Any of the following symptoms occur over the next 10 days:  You have a temperature by mouth above 102 F (38.9 C), not controlled by medicine.  Shortness of breath.  Weakness after normal activity.  The white part of the eye turns yellow (jaundice).  You have a decrease in the amount of urine or are urinating less often.  Your urine turns a dark color or changes to pink, red, or brown. Document Released: 04/07/2000 Document Revised: 07/03/2011 Document Reviewed: 11/25/2007 [facility] Patient Information 2014 [place], [place].  _______________________________________________________________________

## 2017-10-10 ENCOUNTER — Telehealth: Payer: Self-pay

## 2017-10-10 ENCOUNTER — Encounter (HOSPITAL_COMMUNITY): Payer: Self-pay

## 2017-10-10 ENCOUNTER — Other Ambulatory Visit: Payer: Self-pay

## 2017-10-10 ENCOUNTER — Encounter (HOSPITAL_COMMUNITY)
Admission: RE | Admit: 2017-10-10 | Discharge: 2017-10-10 | Disposition: A | Discharge status: Home / Self Care | Payer: Managed Care, Other (non HMO) | Source: Ambulatory Visit | Attending: Gynecologic Oncology | Admitting: Gynecologic Oncology

## 2017-10-10 DIAGNOSIS — Z01812 Encounter for preprocedural laboratory examination: Secondary | ICD-10-CM | POA: Diagnosis not present

## 2017-10-10 DIAGNOSIS — Z01810 Encounter for preprocedural cardiovascular examination: Secondary | ICD-10-CM | POA: Insufficient documentation

## 2017-10-10 DIAGNOSIS — I517 Cardiomegaly: Secondary | ICD-10-CM | POA: Insufficient documentation

## 2017-10-10 DIAGNOSIS — C541 Malignant neoplasm of endometrium: Secondary | ICD-10-CM | POA: Insufficient documentation

## 2017-10-10 DIAGNOSIS — I10 Essential (primary) hypertension: Secondary | ICD-10-CM | POA: Insufficient documentation

## 2017-10-10 LAB — COMPREHENSIVE METABOLIC PANEL
ALT: 23 U/L (ref 14–54)
AST: 24 U/L (ref 15–41)
Albumin: 4.2 g/dL (ref 3.5–5.0)
Alkaline Phosphatase: 68 U/L (ref 38–126)
Anion gap: 12 (ref 5–15)
BILIRUBIN TOTAL: 0.7 mg/dL (ref 0.3–1.2)
BUN: 13 mg/dL (ref 6–20)
CHLORIDE: 101 mmol/L (ref 101–111)
CO2: 28 mmol/L (ref 22–32)
CREATININE: 0.76 mg/dL (ref 0.44–1.00)
Calcium: 9.8 mg/dL (ref 8.9–10.3)
GFR calc Af Amer: >60 mL/min (ref >60)
Glucose, Bld: 113 mg/dL — ABNORMAL HIGH (ref 65–99)
Potassium: 3.2 mmol/L — ABNORMAL LOW (ref 3.5–5.1)
Sodium: 141 mmol/L (ref 135–145)
Total Protein: 7.9 g/dL (ref 6.5–8.1)

## 2017-10-10 LAB — CBC
HCT: 35.3 % — ABNORMAL LOW (ref 36.0–46.0)
HEMOGLOBIN: 12 g/dL (ref 12.0–15.0)
MCH: 28.3 pg (ref 26.0–34.0)
MCHC: 34 g/dL (ref 30.0–36.0)
MCV: 83.3 fL (ref 78.0–100.0)
PLATELETS: 332 10*3/uL (ref 150–400)
RBC: 4.24 MIL/uL (ref 3.87–5.11)
RDW: 14.1 % (ref 11.5–15.5)
WBC: 6.4 10*3/uL (ref 4.0–10.5)

## 2017-10-10 LAB — HEMOGLOBIN A1C
HEMOGLOBIN A1C: 6 % — AB (ref 4.8–5.6)
Mean Plasma Glucose: 125.5 mg/dL

## 2017-10-10 NOTE — Telephone Encounter (Signed)
Outgoing call to patient per [facility] NP-regarding results of potassium level is 3.2. No answer, left her VM to return our call.  When she calls back, encourage potassium rich foods per Joylene John NP ie bananas, apricots, cantaloupe, oranges, lima beans, pinto beans, grapefruit, dried prunes, raisins, cooked spinach or broccoli, and sweet potatoes.

## 2017-10-10 NOTE — Progress Notes (Signed)
10-10-17 Pt will come in on 10-11-17 for UA lab. Also awaiting HGA1C results. Chart left with Ivin Booty, RN

## 2017-10-10 NOTE — Telephone Encounter (Signed)
Received call back from pt- results given for Potassium and discussed potassium rich foods as noted in last telephone note.  Pt voiced understanding. No other needs per pt at this time.

## 2017-10-11 ENCOUNTER — Encounter (HOSPITAL_COMMUNITY)
Admission: RE | Admit: 2017-10-11 | Discharge: 2017-10-11 | Disposition: A | Discharge status: Home / Self Care | Payer: Managed Care, Other (non HMO) | Source: Ambulatory Visit | Attending: Gynecologic Oncology | Admitting: Gynecologic Oncology

## 2017-10-11 DIAGNOSIS — D251 Intramural leiomyoma of uterus: Secondary | ICD-10-CM | POA: Diagnosis not present

## 2017-10-11 DIAGNOSIS — J45909 Unspecified asthma, uncomplicated: Secondary | ICD-10-CM | POA: Diagnosis not present

## 2017-10-11 DIAGNOSIS — Z79899 Other long term (current) drug therapy: Secondary | ICD-10-CM | POA: Diagnosis not present

## 2017-10-11 DIAGNOSIS — Z800 Family history of malignant neoplasm of digestive organs: Secondary | ICD-10-CM | POA: Diagnosis not present

## 2017-10-11 DIAGNOSIS — Z6841 Body Mass Index (BMI) 40.0 and over, adult: Secondary | ICD-10-CM | POA: Diagnosis not present

## 2017-10-11 DIAGNOSIS — Z7982 Long term (current) use of aspirin: Secondary | ICD-10-CM | POA: Diagnosis not present

## 2017-10-11 DIAGNOSIS — E119 Type 2 diabetes mellitus without complications: Secondary | ICD-10-CM | POA: Diagnosis not present

## 2017-10-11 DIAGNOSIS — Z01812 Encounter for preprocedural laboratory examination: Secondary | ICD-10-CM | POA: Diagnosis not present

## 2017-10-11 DIAGNOSIS — I10 Essential (primary) hypertension: Secondary | ICD-10-CM | POA: Diagnosis not present

## 2017-10-11 DIAGNOSIS — C541 Malignant neoplasm of endometrium: Secondary | ICD-10-CM | POA: Diagnosis present

## 2017-10-11 LAB — URINALYSIS, ROUTINE W REFLEX MICROSCOPIC
BACTERIA UA: NONE SEEN
Bilirubin Urine: NEGATIVE
GLUCOSE, UA: NEGATIVE mg/dL
Ketones, ur: NEGATIVE mg/dL
LEUKOCYTES UA: NEGATIVE
NITRITE: NEGATIVE
PH: 6 (ref 5.0–8.0)
Protein, ur: 100 mg/dL — AB
RBC / HPF: >50 RBC/hpf — ABNORMAL HIGH (ref 0–5)
SPECIFIC GRAVITY, URINE: 1.017 (ref 1.005–1.030)

## 2017-10-11 LAB — ABO/RH: ABO/RH(D): A POS

## 2017-10-11 NOTE — Progress Notes (Signed)
ua results sent to [facility] cross epic inbasket

## 2017-10-12 NOTE — Progress Notes (Signed)
Pt made aware that her 10-15-17  surgery time has changed from 2:00 PM to 2:45 PM. Pt to arrive at [place] at 12:15 PM, and can remain on a Clear Liquid Diet from Midnight until 8:45 AM. Pt verbalized understanding.

## 2017-10-13 LAB — URINE CULTURE: Culture: NO GROWTH

## 2017-10-14 MED ORDER — DEXTROSE 5 % IV SOLN
3.0000 g | INTRAVENOUS | Status: AC
  Administered 2017-10-15: 3 g via INTRAVENOUS
  Filled 2017-10-14: qty 3

## 2017-10-15 ENCOUNTER — Ambulatory Visit (HOSPITAL_COMMUNITY): Payer: Managed Care, Other (non HMO) | Admitting: Anesthesiology

## 2017-10-15 ENCOUNTER — Encounter (HOSPITAL_COMMUNITY)
Admission: RE | Disposition: A | Discharge status: Home / Self Care | Payer: Self-pay | Source: Ambulatory Visit | Attending: Gynecologic Oncology

## 2017-10-15 ENCOUNTER — Ambulatory Visit (HOSPITAL_COMMUNITY)
Admission: RE | Admit: 2017-10-15 | Discharge: 2017-10-16 | Disposition: A | Discharge status: Home / Self Care | Payer: Managed Care, Other (non HMO) | Source: Ambulatory Visit | Attending: Gynecologic Oncology | Admitting: Gynecologic Oncology

## 2017-10-15 ENCOUNTER — Other Ambulatory Visit: Payer: Self-pay

## 2017-10-15 ENCOUNTER — Encounter (HOSPITAL_COMMUNITY): Payer: Self-pay | Admitting: Anesthesiology

## 2017-10-15 DIAGNOSIS — E119 Type 2 diabetes mellitus without complications: Secondary | ICD-10-CM | POA: Insufficient documentation

## 2017-10-15 DIAGNOSIS — Z79899 Other long term (current) drug therapy: Secondary | ICD-10-CM | POA: Insufficient documentation

## 2017-10-15 DIAGNOSIS — J45909 Unspecified asthma, uncomplicated: Secondary | ICD-10-CM | POA: Insufficient documentation

## 2017-10-15 DIAGNOSIS — C541 Malignant neoplasm of endometrium: Secondary | ICD-10-CM

## 2017-10-15 DIAGNOSIS — Z7982 Long term (current) use of aspirin: Secondary | ICD-10-CM | POA: Insufficient documentation

## 2017-10-15 DIAGNOSIS — Z6841 Body Mass Index (BMI) 40.0 and over, adult: Secondary | ICD-10-CM | POA: Insufficient documentation

## 2017-10-15 DIAGNOSIS — N9489 Other specified conditions associated with female genital organs and menstrual cycle: Secondary | ICD-10-CM

## 2017-10-15 DIAGNOSIS — Z800 Family history of malignant neoplasm of digestive organs: Secondary | ICD-10-CM | POA: Insufficient documentation

## 2017-10-15 DIAGNOSIS — I10 Essential (primary) hypertension: Secondary | ICD-10-CM | POA: Insufficient documentation

## 2017-10-15 DIAGNOSIS — D251 Intramural leiomyoma of uterus: Secondary | ICD-10-CM | POA: Insufficient documentation

## 2017-10-15 HISTORY — PX: LAPAROTOMY: SHX154

## 2017-10-15 HISTORY — PX: ROBOTIC ASSISTED TOTAL HYSTERECTOMY WITH BILATERAL SALPINGO OOPHERECTOMY: SHX6086

## 2017-10-15 LAB — GLUCOSE, CAPILLARY
GLUCOSE-CAPILLARY: 112 mg/dL — AB (ref 65–99)
GLUCOSE-CAPILLARY: 122 mg/dL — AB (ref 65–99)

## 2017-10-15 LAB — TYPE AND SCREEN
ABO/RH(D): A POS
Antibody Screen: NEGATIVE

## 2017-10-15 SURGERY — HYSTERECTOMY, TOTAL, ROBOT-ASSISTED, LAPAROSCOPIC, WITH BILATERAL SALPINGO-OOPHORECTOMY
Anesthesia: General

## 2017-10-15 MED ORDER — BUPIVACAINE HCL (PF) 0.25 % IJ SOLN
INTRAMUSCULAR | Status: AC
  Filled 2017-10-15: qty 30

## 2017-10-15 MED ORDER — CELECOXIB 200 MG PO CAPS
400.0000 mg | ORAL_CAPSULE | ORAL | Status: AC
  Administered 2017-10-15: 400 mg via ORAL
  Filled 2017-10-15: qty 2

## 2017-10-15 MED ORDER — LIDOCAINE 2% (20 MG/ML) 5 ML SYRINGE
INTRAMUSCULAR | Status: AC
  Filled 2017-10-15: qty 5

## 2017-10-15 MED ORDER — ONDANSETRON HCL 4 MG/2ML IJ SOLN: 4.0000 mg | Freq: Four times a day (QID) | INTRAMUSCULAR | Status: DC | PRN

## 2017-10-15 MED ORDER — ENOXAPARIN SODIUM 40 MG/0.4ML ~~LOC~~ SOLN
40.0000 mg | SUBCUTANEOUS | Status: AC
  Administered 2017-10-15: 40 mg via SUBCUTANEOUS
  Filled 2017-10-15: qty 0.4

## 2017-10-15 MED ORDER — GABAPENTIN 300 MG PO CAPS
300.0000 mg | ORAL_CAPSULE | ORAL | Status: AC
  Administered 2017-10-15: 300 mg via ORAL
  Filled 2017-10-15: qty 1

## 2017-10-15 MED ORDER — PROPOFOL 10 MG/ML IV BOLUS
INTRAVENOUS | Status: AC
  Filled 2017-10-15: qty 20

## 2017-10-15 MED ORDER — DEXAMETHASONE SODIUM PHOSPHATE 10 MG/ML IJ SOLN
INTRAMUSCULAR | Status: AC
  Filled 2017-10-15: qty 1

## 2017-10-15 MED ORDER — LACTATED RINGERS IR SOLN
Status: DC | PRN
  Administered 2017-10-15: 1000 mL

## 2017-10-15 MED ORDER — SUGAMMADEX SODIUM 200 MG/2ML IV SOLN
INTRAVENOUS | Status: AC
  Filled 2017-10-15: qty 2

## 2017-10-15 MED ORDER — SUGAMMADEX SODIUM 200 MG/2ML IV SOLN
INTRAVENOUS | Status: DC | PRN
  Administered 2017-10-15: 300 mg via INTRAVENOUS

## 2017-10-15 MED ORDER — INDOCYANINE GREEN 25 MG IV SOLR
INTRAVENOUS | Status: DC | PRN
  Administered 2017-10-15: 5 mg

## 2017-10-15 MED ORDER — ACETAMINOPHEN 500 MG PO TABS
1000.0000 mg | ORAL_TABLET | ORAL | Status: AC
  Administered 2017-10-15: 1000 mg via ORAL
  Filled 2017-10-15: qty 2

## 2017-10-15 MED ORDER — ENSURE SURGERY PO LIQD
237.0000 mL | Freq: Two times a day (BID) | ORAL | Status: DC
  Administered 2017-10-16: 237 mL via ORAL
  Filled 2017-10-15 (×2): qty 237

## 2017-10-15 MED ORDER — ACETAMINOPHEN 500 MG PO TABS
1000.0000 mg | ORAL_TABLET | Freq: Four times a day (QID) | ORAL | Status: AC
  Administered 2017-10-15 – 2017-10-16 (×4): 1000 mg via ORAL
  Filled 2017-10-15 (×5): qty 2

## 2017-10-15 MED ORDER — BUPIVACAINE LIPOSOME 1.3 % IJ SUSP
20.0000 mL | Freq: Once | INTRAMUSCULAR | Status: AC
  Administered 2017-10-15: 20 mL
  Filled 2017-10-15: qty 20

## 2017-10-15 MED ORDER — LIDOCAINE 2% (20 MG/ML) 5 ML SYRINGE
INTRAMUSCULAR | Status: DC | PRN
  Administered 2017-10-15: 40 mg via INTRAVENOUS

## 2017-10-15 MED ORDER — STERILE WATER FOR IRRIGATION IR SOLN
Status: DC | PRN
  Administered 2017-10-15: 1000 mL

## 2017-10-15 MED ORDER — OXYCODONE-ACETAMINOPHEN 5-325 MG PO TABS: 1.0000 | ORAL_TABLET | ORAL | Status: DC | PRN

## 2017-10-15 MED ORDER — MIDAZOLAM HCL 2 MG/2ML IJ SOLN
INTRAMUSCULAR | Status: AC
  Filled 2017-10-15: qty 2

## 2017-10-15 MED ORDER — PROPOFOL 10 MG/ML IV BOLUS
INTRAVENOUS | Status: DC | PRN
  Administered 2017-10-15: 180 mg via INTRAVENOUS
  Administered 2017-10-15 (×3): 20 mg via INTRAVENOUS
  Administered 2017-10-15: 50 mg via INTRAVENOUS

## 2017-10-15 MED ORDER — PHENYLEPHRINE 40 MCG/ML (10ML) SYRINGE FOR IV PUSH (FOR BLOOD PRESSURE SUPPORT)
PREFILLED_SYRINGE | INTRAVENOUS | Status: DC | PRN
  Administered 2017-10-15 (×5): 80 ug via INTRAVENOUS

## 2017-10-15 MED ORDER — ALBUTEROL SULFATE (2.5 MG/3ML) 0.083% IN NEBU: 2.5000 mg | INHALATION_SOLUTION | Freq: Four times a day (QID) | RESPIRATORY_TRACT | Status: DC | PRN

## 2017-10-15 MED ORDER — OXYCODONE HCL 5 MG/5ML PO SOLN
5.0000 mg | Freq: Once | ORAL | Status: DC | PRN
  Filled 2017-10-15: qty 5

## 2017-10-15 MED ORDER — BUPIVACAINE HCL (PF) 0.25 % IJ SOLN
INTRAMUSCULAR | Status: DC | PRN
  Administered 2017-10-15: 10 mL

## 2017-10-15 MED ORDER — KETAMINE HCL 10 MG/ML IJ SOLN
INTRAMUSCULAR | Status: DC | PRN
  Administered 2017-10-15 (×2): 20 mg via INTRAVENOUS

## 2017-10-15 MED ORDER — CEFAZOLIN SODIUM-DEXTROSE 2-4 GM/100ML-% IV SOLN
INTRAVENOUS | Status: AC
  Filled 2017-10-15: qty 200

## 2017-10-15 MED ORDER — LACTATED RINGERS IV SOLN: INTRAVENOUS | Status: DC

## 2017-10-15 MED ORDER — PROMETHAZINE HCL 25 MG/ML IJ SOLN: 6.2500 mg | INTRAMUSCULAR | Status: DC | PRN

## 2017-10-15 MED ORDER — DEXAMETHASONE SODIUM PHOSPHATE 10 MG/ML IJ SOLN
INTRAMUSCULAR | Status: DC | PRN
  Administered 2017-10-15: 10 mg via INTRAVENOUS

## 2017-10-15 MED ORDER — HYDROCHLOROTHIAZIDE 25 MG PO TABS
50.0000 mg | ORAL_TABLET | Freq: Every day | ORAL | Status: DC
  Administered 2017-10-15 – 2017-10-16 (×2): 50 mg via ORAL
  Filled 2017-10-15 (×3): qty 2

## 2017-10-15 MED ORDER — PHENYLEPHRINE 40 MCG/ML (10ML) SYRINGE FOR IV PUSH (FOR BLOOD PRESSURE SUPPORT)
PREFILLED_SYRINGE | INTRAVENOUS | Status: AC
  Filled 2017-10-15: qty 10

## 2017-10-15 MED ORDER — GABAPENTIN 300 MG PO CAPS
300.0000 mg | ORAL_CAPSULE | Freq: Two times a day (BID) | ORAL | Status: DC
  Administered 2017-10-15 – 2017-10-16 (×2): 300 mg via ORAL
  Filled 2017-10-15 (×3): qty 1

## 2017-10-15 MED ORDER — LACTATED RINGERS IV SOLN
INTRAVENOUS | Status: DC
  Administered 2017-10-15 (×2): via INTRAVENOUS

## 2017-10-15 MED ORDER — FENTANYL CITRATE (PF) 250 MCG/5ML IJ SOLN
INTRAMUSCULAR | Status: DC | PRN
  Administered 2017-10-15: 150 ug via INTRAVENOUS
  Administered 2017-10-15: 100 ug via INTRAVENOUS

## 2017-10-15 MED ORDER — OXYCODONE HCL 5 MG PO TABS: 5.0000 mg | ORAL_TABLET | Freq: Once | ORAL | Status: DC | PRN

## 2017-10-15 MED ORDER — CELECOXIB 200 MG PO CAPS
200.0000 mg | ORAL_CAPSULE | Freq: Two times a day (BID) | ORAL | Status: DC
  Administered 2017-10-15 – 2017-10-16 (×2): 200 mg via ORAL
  Filled 2017-10-15 (×2): qty 1

## 2017-10-15 MED ORDER — POTASSIUM CHLORIDE IN NACL 20-0.45 MEQ/L-% IV SOLN
INTRAVENOUS | Status: DC
  Administered 2017-10-16: 06:00:00 via INTRAVENOUS
  Filled 2017-10-15 (×2): qty 1000

## 2017-10-15 MED ORDER — HYDROMORPHONE HCL 1 MG/ML IJ SOLN: 0.5000 mg | INTRAMUSCULAR | Status: DC | PRN

## 2017-10-15 MED ORDER — ONDANSETRON HCL 4 MG/2ML IJ SOLN
INTRAMUSCULAR | Status: AC
  Filled 2017-10-15: qty 2

## 2017-10-15 MED ORDER — ONDANSETRON HCL 4 MG/2ML IJ SOLN
INTRAMUSCULAR | Status: DC | PRN
  Administered 2017-10-15: 4 mg via INTRAVENOUS

## 2017-10-15 MED ORDER — MIDAZOLAM HCL 5 MG/5ML IJ SOLN
INTRAMUSCULAR | Status: DC | PRN
  Administered 2017-10-15: 2 mg via INTRAVENOUS

## 2017-10-15 MED ORDER — FENTANYL CITRATE (PF) 250 MCG/5ML IJ SOLN
INTRAMUSCULAR | Status: AC
  Filled 2017-10-15: qty 5

## 2017-10-15 MED ORDER — LIDOCAINE 2% (20 MG/ML) 5 ML SYRINGE
INTRAMUSCULAR | Status: DC | PRN
  Administered 2017-10-15: 1.5 mg/kg/h via INTRAVENOUS

## 2017-10-15 MED ORDER — KETOROLAC TROMETHAMINE 30 MG/ML IJ SOLN
30.0000 mg | Freq: Four times a day (QID) | INTRAMUSCULAR | Status: DC
  Administered 2017-10-15 – 2017-10-16 (×4): 30 mg via INTRAVENOUS
  Filled 2017-10-15 (×5): qty 1

## 2017-10-15 MED ORDER — ONDANSETRON HCL 4 MG PO TABS: 4.0000 mg | ORAL_TABLET | Freq: Four times a day (QID) | ORAL | Status: DC | PRN

## 2017-10-15 MED ORDER — ROCURONIUM BROMIDE 100 MG/10ML IV SOLN
INTRAVENOUS | Status: AC
  Filled 2017-10-15: qty 1

## 2017-10-15 MED ORDER — STERILE WATER FOR INJECTION IJ SOLN
INTRAMUSCULAR | Status: AC
  Filled 2017-10-15: qty 10

## 2017-10-15 MED ORDER — HYDROMORPHONE HCL 1 MG/ML IJ SOLN: 0.2500 mg | INTRAMUSCULAR | Status: DC | PRN

## 2017-10-15 MED ORDER — SCOPOLAMINE 1 MG/3DAYS TD PT72
1.0000 | MEDICATED_PATCH | TRANSDERMAL | Status: DC
  Administered 2017-10-15: 1.5 mg via TRANSDERMAL
  Filled 2017-10-15: qty 1

## 2017-10-15 MED ORDER — DEXAMETHASONE SODIUM PHOSPHATE 4 MG/ML IJ SOLN: 4.0000 mg | INTRAMUSCULAR | Status: DC

## 2017-10-15 MED ORDER — ROCURONIUM BROMIDE 100 MG/10ML IV SOLN
INTRAVENOUS | Status: DC | PRN
  Administered 2017-10-15: 20 mg via INTRAVENOUS
  Administered 2017-10-15: 60 mg via INTRAVENOUS

## 2017-10-15 MED ORDER — ENOXAPARIN SODIUM 40 MG/0.4ML ~~LOC~~ SOLN
40.0000 mg | SUBCUTANEOUS | Status: DC
  Administered 2017-10-16: 40 mg via SUBCUTANEOUS
  Filled 2017-10-15: qty 0.4

## 2017-10-15 MED ORDER — STERILE WATER FOR INJECTION IJ SOLN
INTRAMUSCULAR | Status: DC | PRN
  Administered 2017-10-15: 10 mL

## 2017-10-15 SURGICAL SUPPLY — 57 items
ADH SKN CLS APL DERMABOND .7 (GAUZE/BANDAGES/DRESSINGS) ×12
AGENT HMST KT MTR STRL THRMB (HEMOSTASIS)
APL ESCP 34 STRL LF DISP (HEMOSTASIS) ×2
APPLICATOR SURGIFLO ENDO (HEMOSTASIS) ×1 IMPLANT
BAG LAPAROSCOPIC 12 15 PORT 16 (BASKET) IMPLANT
BAG RETRIEVAL 12/15 (BASKET) ×3
BAG SPEC RTRVL LRG 6X4 10 (ENDOMECHANICALS) ×4
COVER BACK TABLE 60X90IN (DRAPES) ×3 IMPLANT
COVER TIP SHEARS 8 DVNC (MISCELLANEOUS) ×2 IMPLANT
COVER TIP SHEARS 8MM DA VINCI (MISCELLANEOUS) ×1
DERMABOND ADVANCED (GAUZE/BANDAGES/DRESSINGS) ×6
DERMABOND ADVANCED .7 DNX12 (GAUZE/BANDAGES/DRESSINGS) ×2 IMPLANT
DRAPE ARM DVNC X/XI (DISPOSABLE) ×8 IMPLANT
DRAPE COLUMN DVNC XI (DISPOSABLE) ×2 IMPLANT
DRAPE DA VINCI XI ARM (DISPOSABLE) ×4
DRAPE DA VINCI XI COLUMN (DISPOSABLE) ×1
DRAPE SHEET LG 3/4 BI-LAMINATE (DRAPES) ×3 IMPLANT
DRAPE SURG IRRIG POUCH 19X23 (DRAPES) ×3 IMPLANT
DRSG OPSITE POSTOP 3X4 (GAUZE/BANDAGES/DRESSINGS) ×1 IMPLANT
ELECT REM PT RETURN 15FT ADLT (MISCELLANEOUS) ×3 IMPLANT
FLOSEAL 5ML (HEMOSTASIS) ×2 IMPLANT
GLOVE BIO SURGEON STRL SZ 6 (GLOVE) ×12 IMPLANT
GLOVE BIO SURGEON STRL SZ 6.5 (GLOVE) IMPLANT
GOWN STRL REUS W/ TWL LRG LVL3 (GOWN DISPOSABLE) ×4 IMPLANT
GOWN STRL REUS W/TWL LRG LVL3 (GOWN DISPOSABLE) ×6
HOLDER FOLEY CATH W/STRAP (MISCELLANEOUS) ×2 IMPLANT
IRRIG SUCT STRYKERFLOW 2 WTIP (MISCELLANEOUS) ×3
IRRIGATION SUCT STRKRFLW 2 WTP (MISCELLANEOUS) ×2 IMPLANT
KIT PROCEDURE DA VINCI SI (MISCELLANEOUS) ×1
KIT PROCEDURE DVNC SI (MISCELLANEOUS) IMPLANT
LEGGING LITHOTOMY PAIR STRL (DRAPES) ×1 IMPLANT
MANIPULATOR UTERINE 4.5 ZUMI (MISCELLANEOUS) ×3 IMPLANT
NDL SPNL 18GX3.5 QUINCKE PK (NEEDLE) IMPLANT
NEEDLE SPNL 18GX3.5 QUINCKE PK (NEEDLE) ×3 IMPLANT
OBTURATOR OPTICAL STANDARD 8MM (TROCAR) ×1
OBTURATOR OPTICAL STND 8 DVNC (TROCAR) ×2
OBTURATOR OPTICALSTD 8 DVNC (TROCAR) ×2 IMPLANT
PACK ROBOT GYN CUSTOM WL (TRAY / TRAY PROCEDURE) ×3 IMPLANT
PAD POSITIONING PINK XL (MISCELLANEOUS) ×3 IMPLANT
PENCIL BUTTON HOLSTER BLD 10FT (ELECTRODE) ×1 IMPLANT
POUCH SPECIMEN RETRIEVAL 10MM (ENDOMECHANICALS) ×2 IMPLANT
SEAL CANN UNIV 5-8 DVNC XI (MISCELLANEOUS) ×8 IMPLANT
SEAL XI 5MM-8MM UNIVERSAL (MISCELLANEOUS) ×4
SET TRI-LUMEN FLTR TB AIRSEAL (TUBING) ×3 IMPLANT
SPONGE LAP 18X18 X RAY DECT (DISPOSABLE) ×1 IMPLANT
SURGIFLO W/THROMBIN 8M KIT (HEMOSTASIS) IMPLANT
SUT VIC AB 0 CT1 27 (SUTURE) ×6
SUT VIC AB 0 CT1 27XBRD ANTBC (SUTURE) IMPLANT
SUT VIC AB 2-0 CT1 27 (SUTURE) ×3
SUT VIC AB 2-0 CT1 27XBRD (SUTURE) IMPLANT
SYR 10ML LL (SYRINGE) ×1 IMPLANT
TOWEL OR NON WOVEN STRL DISP B (DISPOSABLE) ×3 IMPLANT
TRAP SPECIMEN MUCOUS 40CC (MISCELLANEOUS) IMPLANT
TRAY FOLEY MTR SLVR 16FR STAT (SET/KITS/TRAYS/PACK) ×3 IMPLANT
UNDERPAD 30X30 (UNDERPADS AND DIAPERS) ×3 IMPLANT
WATER STERILE IRR 1000ML POUR (IV SOLUTION) ×3 IMPLANT
YANKAUER SUCT BULB TIP NO VENT (SUCTIONS) ×1 IMPLANT

## 2017-10-15 NOTE — Discharge Instructions (Signed)
10/15/2017  Return to work: 4 weeks  Activity: 1. Be up and out of the bed during the day.  Take a nap if needed.  You may walk up steps but be careful and use the hand rail.  Stair climbing will tire you more than you think, you may need to stop part way and rest.   2. No lifting or straining for 6 weeks.  3. No driving for 1 weeks.  Do Not drive if you are taking narcotic pain medicine.  4. Shower daily.  Use soap and water on your incision and pat dry; don't rub.   5. No sexual activity and nothing in the vagina for 8 weeks.  Medications:  - Take ibuprofen and tylenol first line for pain control. Take these regularly (every 6 hours) to decrease the build up of pain.  - If necessary, for severe pain not relieved by ibuprofen, take percocet.  - While taking percocet you should take sennakot every night to reduce the likelihood of constipation. If this causes diarrhea, stop its use.  Diet: 1. Low sodium Heart Healthy Diet is recommended.  2. It is safe to use a laxative if you have difficulty moving your bowels.   Wound Care: 1. Keep clean and dry.  Shower daily.  Reasons to call the Doctor:   Fever - Oral temperature greater than 100.4 degrees Fahrenheit  Foul-smelling vaginal discharge  Difficulty urinating  Nausea and vomiting  Increased pain at the site of the incision that is unrelieved with pain medicine.  Difficulty breathing with or without chest pain  New calf pain especially if only on one side  Sudden, continuing increased vaginal bleeding with or without clots.   Follow-up: 1. See Everitt Amber in 2-3 weeks.  Contacts: For questions or concerns you should contact:  Dr. Everitt Amber at [phone] After hours and on week-ends call [phone] and ask to speak to the physician on call for Gynecologic Oncology

## 2017-10-15 NOTE — Transfer of Care (Signed)
Immediate Anesthesia Transfer of Care Note  Patient: Louanne Belton  Procedure(s) Performed: XI ROBOTIC ASSISTED TOTAL HYSTERECTOMY GREATER THAN 250 GRAMS WITH BILATERAL SALPINGO OOPHORECTOMY, SENTINAL LYMPH NODE BIOPSY , PERI AND PARA- AORTICC LYMPH NODE DISECTIOIN (Bilateral ) MINI LAPAROTOMY (N/A )  Patient Location: PACU  Anesthesia Type:General  Level of Consciousness: sedated  Airway & Oxygen Therapy: Patient Spontanous Breathing and Patient connected to face mask oxygen  Post-op Assessment: Report given to RN and Post -op Vital signs reviewed and stable  Post vital signs: Reviewed and stable  Last Vitals:  Vitals Value Taken Time  BP 128/77 10/15/2017  4:41 PM  Temp    Pulse 88 10/15/2017  4:42 PM  Resp 16 10/15/2017  4:42 PM  SpO2 95 % 10/15/2017  4:42 PM  Vitals shown include unvalidated device data.  Last Pain:  Vitals:   10/15/17 1301  TempSrc:   PainSc: 0-No pain         Complications: No apparent anesthesia complications

## 2017-10-15 NOTE — Anesthesia Preprocedure Evaluation (Addendum)
Anesthesia Evaluation  Patient identified by MRN, date of birth, ID band Patient awake    Reviewed: Allergy & Precautions, NPO status , Patient's Chart, lab work & pertinent test results  Airway Mallampati: II  TM Distance: >3 FB Neck ROM: Full    Dental no notable dental hx.    Pulmonary asthma (mild, controlled) ,    Pulmonary exam normal breath sounds clear to auscultation       Cardiovascular hypertension, Pt. on medications Normal cardiovascular exam Rhythm:Regular Rate:Normal  ECG: NSR, rate 87. LVH. Possible LAE   Neuro/Psych negative neurological ROS  negative psych ROS   GI/Hepatic negative GI ROS, Neg liver ROS,   Endo/Other  diabetesMorbid obesitySuper obese  Renal/GU negative Renal ROS     Musculoskeletal negative musculoskeletal ROS (+)   Abdominal (+) + obese,   Peds  Hematology negative hematology ROS (+) anemia ,   Anesthesia Other Findings endometrial cancer  Reproductive/Obstetrics                            Anesthesia Physical Anesthesia Plan  ASA: IV  Anesthesia Plan: General   Post-op Pain Management:    Induction: Intravenous  PONV Risk Score and Plan: 4 or greater and Scopolamine patch - Pre-op, Midazolam, Dexamethasone, Ondansetron and Treatment may vary due to age or medical condition  Airway Management Planned: Oral ETT  Additional Equipment:   Intra-op Plan:   Post-operative Plan: Extubation in OR  Informed Consent: I have reviewed the patients History and Physical, chart, labs and discussed the procedure including the risks, benefits and alternatives for the proposed anesthesia with the patient or authorized representative who has indicated his/her understanding and acceptance.   Dental advisory given  Plan Discussed with: CRNA  Anesthesia Plan Comments:        Anesthesia Quick Evaluation

## 2017-10-15 NOTE — Interval H&P Note (Signed)
History and Physical Interval Note:  10/15/2017 12:43 PM  Morgan Vincent  has presented today for surgery, with the diagnosis of endometrial cancer  The various methods of treatment have been discussed with the patient and family. After consideration of risks, benefits and other options for treatment, the patient has consented to  Procedure(s): XI ROBOTIC ASSISTED TOTAL HYSTERECTOMY GREATER THAN 250 GRAMS WITH BILATERAL SALPINGO OOPHORECTOMY, SENTINAL LYMPH NODE BIOPSY AND MINI LAPAROTOMY (Bilateral) MINI LAPAROTOMY (N/A) as a surgical intervention .  The patient's history has been reviewed, patient examined, no change in status, stable for surgery.  I have reviewed the patient's chart and labs.  Questions were answered to the patient's satisfaction.     Thereasa Solo

## 2017-10-15 NOTE — Anesthesia Procedure Notes (Signed)
Procedure Name: Intubation Date/Time: 10/15/2017 1:39 PM Performed by: Eulas Post, Ross Hefferan W, CRNA Pre-anesthesia Checklist: Patient identified, Emergency Drugs available, Suction available and Patient being monitored Patient Re-evaluated:Patient Re-evaluated prior to induction Oxygen Delivery Method: Circle system utilized Preoxygenation: Pre-oxygenation with 100% oxygen Induction Type: IV induction Ventilation: Mask ventilation without difficulty Laryngoscope Size: Miller, 2 and Glidescope (unable to see cords with miller, used glide) Grade View: Grade II Tube type: Oral Tube size: 7.0 mm Number of attempts: 1 Airway Equipment and Method: Stylet Placement Confirmation: ETT inserted through vocal cords under direct vision,  positive ETCO2 and breath sounds checked- equal and bilateral Secured at: 22 cm Tube secured with: Tape Dental Injury: Teeth and Oropharynx as per pre-operative assessment

## 2017-10-15 NOTE — Anesthesia Postprocedure Evaluation (Signed)
Anesthesia Post Note  Patient: Morgan Vincent  Procedure(s) Performed: XI ROBOTIC ASSISTED TOTAL HYSTERECTOMY GREATER THAN 250 GRAMS WITH BILATERAL SALPINGO OOPHORECTOMY, SENTINAL LYMPH NODE BIOPSY , PERI AND PARA- AORTICC LYMPH NODE DISECTIOIN (Bilateral ) MINI LAPAROTOMY (N/A )     Patient location during evaluation: PACU Anesthesia Type: General Level of consciousness: awake and alert Pain management: pain level controlled Vital Signs Assessment: post-procedure vital signs reviewed and stable Respiratory status: spontaneous breathing, nonlabored ventilation, respiratory function stable and patient connected to nasal cannula oxygen Cardiovascular status: blood pressure returned to baseline and stable Postop Assessment: no apparent nausea or vomiting Anesthetic complications: no    Last Vitals:  Vitals:   10/15/17 1715 10/15/17 1730  BP: 126/82 (!) 141/82  Pulse: 92 93  Resp: 15 14  Temp: 36.4 C 36.7 C  SpO2: 95% 97%    Last Pain:  Vitals:   10/15/17 1730  TempSrc:   PainSc: Asleep                 Ryan P Ellender

## 2017-10-15 NOTE — Op Note (Signed)
OPERATIVE NOTE 10/15/17  Surgeon: Donaciano Eva   Assistants: Dr Lahoma Crocker (an MD assistant was necessary for tissue manipulation, management of robotic instrumentation, retraction and positioning due to the complexity of the case and hospital policies).   Anesthesia: General endotracheal anesthesia  ASA Class: 3   Pre-operative Diagnosis: endometrial cancer grade 3, fibroid uterus  Post-operative Diagnosis: same,   Operation: Robotic-assisted laparoscopic total hysterectomy >250gm with bilateral salpingoophorectomy, SLN biopsy, pelvic and para-aortic lymphadenectomy  Surgeon: Donaciano Eva  Assistant Surgeon: Precious Haws MD  Anesthesia: GET  Urine Output: 300cc  Operative Findings:  : 14cm bulky fibroid uterus with adhesions to appendix.  Slightly enlarged right obturator SLN.  Unilateral mapping to right side.  No gross extrauterine disease.   Estimated Blood Loss:  less than 50 mL      Total IV Fluids: 600 ml         Specimens: uterus, cervix, bilateral tubes and ovaries, right obturator SLN, left pelvic lymph nodes, left para-aortic lymph nodes.         Complications:  None; patient tolerated the procedure well.         Disposition: PACU - hemodynamically stable.  Procedure Details  The patient was seen in the Holding Room. The risks, benefits, complications, treatment options, and expected outcomes were discussed with the patient.  The patient concurred with the proposed plan, giving informed consent.  The site of surgery properly noted/marked. The patient was identified as Morgan Vincent and the procedure verified as a Robotic-assisted hysterectomy with bilateral salpingo oophorectomy with SLN biopsy. A Time Out was held and the above information confirmed.  After induction of anesthesia, the patient was draped and prepped in the usual sterile manner. Pt was placed in supine position after anesthesia and draped and prepped in the usual sterile  manner. The abdominal drape was placed after the CholoraPrep had been allowed to dry for 3 minutes.  Her arms were tucked to her side with all appropriate precautions.  The shoulders were stabilized with padded shoulder blocks applied to the acromium processes.  The patient was placed in the semi-lithotomy position in [place].  The perineum was prepped with Betadine. The patient was then prepped. Foley catheter was placed.  A sterile speculum was placed in the vagina.  The cervix was grasped with a single-tooth tenaculum. 2mg  total of ICG was injected into the cervical stroma at 2 and 9 o'clock with 1cc injected at a 1cm and 79mm depth (concentration 0.5mg /ml) in all locations. The cervix was dilated with Kennon Rounds dilators.  The ZUMI uterine manipulator with a medium colpotomizer ring was placed without difficulty.  A pneum occluder balloon was placed over the manipulator.  OG tube placement was confirmed and to suction.   Next, a 5 mm skin incision was made 1 cm below the subcostal margin in the midclavicular line.  The 5 mm Optiview port and scope was used for direct entry.  Opening pressure was under 10 mm CO2.  The abdomen was insufflated and the findings were noted as above.   At this point and all points during the procedure, the patient's intra-abdominal pressure did not exceed 15 mmHg. Next, a 10 mm skin incision was made in the umbilicus and a right and left port was placed about 10 cm lateral to the robot port on the right and left side.  A fourth arm was placed in the left lower quadrant 2 cm above and superior and medial to the anterior superior iliac spine.  All ports were placed under direct visualization.  The patient was placed in steep Trendelenburg.  Bowel was folded away into the upper abdomen.  The robot was docked in the normal manner.  The right and left peritoneum were opened parallel to the IP ligament to open the retroperitoneal spaces bilaterally. The SLN mapping was performed in  bilateral pelvic basins. The para rectal and paravesical spaces were opened up entirely with careful dissection below the level of the ureters bilaterally and to the depth of the uterine artery origin in order to skeletonize the uterine "web" and ensure visualization of all parametrial channels. The para-aortic basins were carefully exposed and evaluated for isolated para-aortic SLN's. Lymphatic channels were identified travelling to the following visualized sentinel lymph node's: right obturator SLN, it was a unilateral mapping on the right. This SLN was separated from their surrounding lymphatic tissue, removed and sent for permanent pathology.  The paravesical space was developed with monopolar and sharp dissection. It was held open with tension on the median umbilical ligament with the forth arm. The paravesical space was opened with blunt and sharp dissection to mobilize the ureter off of the medial surface of the internal iliac artery. The medial leaf of the broad ligament containing the ureter was held medially (opening the pararectal space) by the assistant's grasper. The left pelvic lymphadenectomy was performed by skeletonizing the internal iliac artery at the bifurcation with the external iliac artery. The obturator nerve was identified in the base of lateral paravesical space. The ureter was mobilized medially off of the dissection by developing the pararectal space. The genitofemoral nerve was identified, skeletonized and mobilized laterally off of the external iliac artery. An enbloc resection of lymph nodes was performed within the following boundaries: the mid portion of the common iliac proximally, the circumflex iliac vein distally, the obturator nerve posteriorally, the genitofemoral nerve laterally. The nodal basin (including obturator space) were confirmed to be empty of nodes and hemostatic. The nodes were placed in an endocatch bag and retrieved vaginally.   The left para-aortic  lymphadenectomy was performed by incising the peritoneum overlying the right medial common iliac artery, elevating the peritoneal edge and mobilizing the sigmoid mesentery from the underlying aorta. The left para-aortic retroperitoneal space (between the sigmoid mesentery and the lymph node bundle) was developed and the left ureter was identified and retracted with the forth arm. The left para-aortic nodes were removed en bloc by using monopolar and sharp dissection to separate all nodal fatty tissue between the mid portion of the left common iliac artery inferiorally and the IMA superiorally, the aorta medially, the vertebral bodies posteriorally and the tendon of the psoas muscle laterally.  The hysterectomy was started after the round ligament on the right side was incised and the retroperitoneum was entered and the pararectal space was developed.  The ureter was noted to be on the medial leaf of the broad ligament.  The peritoneum above the ureter was incised and stretched and the infundibulopelvic ligament was skeletonized, cauterized and cut.  The posterior peritoneum was taken down to the level of the KOH ring.  The anterior peritoneum was also taken down.  The bladder flap was created to the level of the KOH ring.  The uterine artery on the right side was skeletonized, cauterized and cut in the normal manner.  A similar procedure was performed on the left.  The colpotomy was made and the uterus, cervix, bilateral ovaries and tubes were amputated but could not be delivered through the  vagina due to uterine size. Instead they were placed in a 15cm endocatch bag for mini-laparotomy retrieval.  Pedicles were inspected and excellent hemostasis was achieved.  Raytecs and lymph node specimens were removed vaginally.  The colpotomy at the vaginal cuff was closed with Vicryl on a CT1 needle in a running manner.  Irrigation was used and excellent hemostasis was achieved.  At this point in the robotic procedure was  completed.  Robotic instruments were removed under direct visulaization.  The robot was undocked.   A 6cm suprapubic low transverse incision was made with the scalpel. The subcutaneous skin was opened with the bovie. The fascia was opened with the bovie transversely and the rectus muscles were dissected off of the fascia inferiorally and superiorally. The peritoneum was opened sharply in the midline. The peritoneal incision was extended. The uterine specimen in the endocatch bag was retrieved through the incision. The fascia was closed with running 0-vicryl. The subcutaneous fat was closed with 2-0 vicryl. 20cc of exparel mixed with 20cc of marcaine was infiltrated into the incision. The incision was closed at the skin with monocryl and dermabond.   The 10 mm ports were closed with Vicryl on a UR-5 needle and the fascia was closed with 0 Vicryl on a UR-5 needle.  The skin was closed with 4-0 Vicryl in a subcuticular manner.  Dermabond was applied.  Sponge, lap and needle counts correct x 2.  The patient was taken to the recovery room in stable condition.  The vagina was swabbed with bleeding noted from the introitus posteriorally. This was made hemostatic with 3-0 vicryl suture.   All instrument and needle counts were correct x  3.   The patient was transferred to the recovery room in a stable condition.  Donaciano Eva, MD

## 2017-10-16 ENCOUNTER — Encounter (HOSPITAL_COMMUNITY): Payer: Self-pay | Admitting: Gynecologic Oncology

## 2017-10-16 DIAGNOSIS — C541 Malignant neoplasm of endometrium: Secondary | ICD-10-CM | POA: Diagnosis not present

## 2017-10-16 LAB — BASIC METABOLIC PANEL
Anion gap: 8 (ref 5–15)
BUN: 11 mg/dL (ref 8–23)
CO2: 30 mmol/L (ref 22–32)
Calcium: 8.9 mg/dL (ref 8.9–10.3)
Chloride: 102 mmol/L (ref 98–111)
Creatinine, Ser: 0.79 mg/dL (ref 0.44–1.00)
GFR calc non Af Amer: >60 mL/min (ref >60)
Glucose, Bld: 125 mg/dL — ABNORMAL HIGH (ref 70–99)
POTASSIUM: 3.3 mmol/L — AB (ref 3.5–5.1)
SODIUM: 140 mmol/L (ref 135–145)

## 2017-10-16 LAB — CBC
HEMATOCRIT: 32.1 % — AB (ref 36.0–46.0)
HEMOGLOBIN: 11 g/dL — AB (ref 12.0–15.0)
MCH: 28.1 pg (ref 26.0–34.0)
MCHC: 34.3 g/dL (ref 30.0–36.0)
MCV: 81.9 fL (ref 78.0–100.0)
PLATELETS: 306 10*3/uL (ref 150–400)
RBC: 3.92 MIL/uL (ref 3.87–5.11)
RDW: 13.8 % (ref 11.5–15.5)
WBC: 8.6 10*3/uL (ref 4.0–10.5)

## 2017-10-16 MED ORDER — ENOXAPARIN SODIUM 40 MG/0.4ML ~~LOC~~ SOLN
40.0000 mg | SUBCUTANEOUS | 2 refills | Status: DC
Start: 2017-10-17 — End: 2017-12-21

## 2017-10-16 MED ORDER — ENOXAPARIN (LOVENOX) PATIENT EDUCATION KIT
PACK | Freq: Once | Status: AC
  Administered 2017-10-16: 15:00:00
  Filled 2017-10-16: qty 1

## 2017-10-16 MED ORDER — OXYCODONE-ACETAMINOPHEN 5-325 MG PO TABS: 1.0000 | ORAL_TABLET | ORAL | 0 refills | Status: DC | PRN

## 2017-10-16 MED FILL — ENOXAPARIN 40 MG/0.4 ML SYR: 40 | 10 days supply | Qty: 4 | Fill #0

## 2017-10-16 NOTE — Addendum Note (Signed)
Addendum  created 10/16/17 1855 by Lollie Sails, CRNA   Charge Capture section accepted

## 2017-10-16 NOTE — Plan of Care (Signed)
Patient discharged home. Discharge instructions given to patient and family, all verbalized understanding and had no questions. Patient left the floor in stable condition

## 2017-10-16 NOTE — Discharge Summary (Signed)
Physician Discharge Summary  Patient ID: Morgan Vincent MRN: [id number] DOB/AGE: 63/63/1957 63 y.o.  Admit date: 10/15/2017 Discharge date: 10/16/2017  Admission Diagnoses: Endometrial cancer [facility])  Discharge Diagnoses:  Principal Problem:   Endometrial cancer [facility]) Active Problems:   Fibroids, intramural   Discharged Condition: good  Hospital Course:  1/ patient was admitted on 10/15/17 for Robotic Hysterectomy/BSO/staging 2/ surgery was uncomplicated  3/ on postoperative day 1 the patient was meeting discharge criteria: tolerating PO, voiding urine, ambulating, pain well controlled on oral medications.  4/ new medications on discharge include Percocet and Lovenox.   Consults: None  Significant Diagnostic Studies: None  Treatments: surgery: as above  Discharge Exam: Blood pressure 120/81, pulse 88, temperature 98.5 F (36.9 C), temperature source Oral, resp. rate 18, height 5' 0.5" (1.537 m), weight 274 lb 9.6 oz (124.6 kg), SpO2 98 %. General appearance: alert and no distress Resp: normal respiratory effort GI: soft, nontender, nondistended Extremities: extremities normal, atraumatic, no cyanosis or edema Incision/Wound:CDI with dermabond. Honeycomb dressing taken off of suprapubic incision  Disposition: Discharge disposition: 01-Home or Self Care       Discharge Instructions    Call MD for:  persistant nausea and vomiting   Complete by:  As directed    Call MD for:  redness, tenderness, or signs of infection (pain, swelling, redness, odor or green/yellow discharge around incision site)   Complete by:  As directed    Call MD for:  temperature >100.4   Complete by:  As directed    Diet - low sodium heart healthy   Complete by:  As directed    Driving Restrictions   Complete by:  As directed    No driving for 1 week and no driving while on narcotic pain medication   Increase activity slowly   Complete by:  As directed    Lifting restrictions   Complete by:   As directed    Nothing greater than 5 pounds for 6 weeks   Sexual Activity Restrictions   Complete by:  As directed    Nothing per vagina for 8 weeks     Allergies as of 10/16/2017   No Known Allergies     Medication List    STOP taking these medications   Fish Oil 1000 MG Caps   megestrol 40 MG tablet Commonly known as:  MEGACE     TAKE these medications   albuterol 108 (90 Base) MCG/ACT inhaler Commonly known as:  PROVENTIL HFA;VENTOLIN HFA Inhale 2 puffs into the lungs every 6 (six) hours as needed for wheezing or shortness of breath.   ALIVE WOMENS 50+ PO Take 1 tablet by mouth daily.   aspirin 81 MG tablet Take 81 mg by mouth daily.   CALCIUM PO Take 1 tablet by mouth daily.   dicyclomine 20 MG tablet Commonly known as:  BENTYL Take 20 mg by mouth every 6 (six) hours.   enoxaparin 40 MG/0.4ML injection Commonly known as:  LOVENOX Inject 0.4 mLs (40 mg total) into the skin daily. Start taking on:  10/17/2017   hydrochlorothiazide 50 MG tablet Commonly known as:  HYDRODIURIL Take 50 mg by mouth daily   naproxen sodium 220 MG tablet Commonly known as:  ALEVE Take 220 mg by mouth.   oxyCODONE-acetaminophen 5-325 MG tablet Commonly known as:  PERCOCET/ROXICET Take 1 tablet by mouth every 4 (four) hours as needed for moderate pain.   Potassium 99 MG Tabs Take 99 mg by mouth daily.   PROBIOTIC PRODUCT  PO Take 1 capsule by mouth daily.   vitamin C 1000 MG tablet Take 1,000 mg by mouth daily.   vitamin E 1000 UNIT capsule Take 1,000 Units by mouth daily.   zinc gluconate 50 MG tablet Take 50 mg by mouth daily.      Follow-up Information    Everitt Amber, MD In 3 weeks.   Specialty:  Gynecologic Oncology Contact information: [address] [place] [address] [phone]           Signed: Isabel Caprice 10/16/2017, 1:06 PM

## 2017-10-18 ENCOUNTER — Telehealth: Payer: Self-pay | Admitting: Gynecologic Oncology

## 2017-10-18 ENCOUNTER — Other Ambulatory Visit: Payer: Self-pay | Admitting: Oncology

## 2017-10-18 DIAGNOSIS — C541 Malignant neoplasm of endometrium: Secondary | ICD-10-CM

## 2017-10-18 NOTE — Telephone Encounter (Signed)
Called to inform patient of results of pathology. Stage IIIC1 endometrial cancer with LVSI and grade 2 pathology. Met seen on SLN on [facility] Recommendation is for chemotherapy with carboplatin and paclitaxel x 6 cycles, and vaginal brachytherarpy due to high/intermed risk factors.  Left message that pathology looked "okay" but I was recommending other treatments. This phone message was left prior to the immunostains being reported which showed the positive node.  I left message that I will call her back tomorrow.  We will set her up to see Radiation Oncology and Medical Oncology.

## 2017-10-19 ENCOUNTER — Encounter: Payer: Self-pay | Admitting: Radiation Oncology

## 2017-10-19 ENCOUNTER — Telehealth: Payer: Self-pay | Admitting: Gynecologic Oncology

## 2017-10-19 NOTE — Telephone Encounter (Signed)
Informed patient of stage IIIC endometrial cancer with disease found in a node. I am recommending adjuvant chemotherapy with 6 cycles carboplatin and paclitaxel and vaginal brachytherapy.    Will scheduled these appointments.  Thereasa Solo, MD

## 2017-10-22 ENCOUNTER — Telehealth: Payer: Self-pay | Admitting: Oncology

## 2017-10-22 NOTE — Telephone Encounter (Signed)
Called Morgan Vincent and scheduled new patient appointment with Dr. Alvy Bimler for 11:15 on 10/29/17.  Patient verbalized agreement of her appointments on 10/29/17 with Dr. Alvy Bimler and Dr. Sondra Come.

## 2017-10-24 MED FILL — ENOXAPARIN 40 MG/0.4 ML SYR: 40 | 10 days supply | Qty: 4 | Fill #1

## 2017-10-24 NOTE — Progress Notes (Signed)
GYN Location of Tumor / Histology: Endometrial; 1. Lymph node, sentinel, biopsy, right obturator - LYMPH NODE, NEGATIVE FOR CARCINOMA (0/1). - IMMUNOHISTOCHEMICAL STAIN FOR PANKERATIN IS PENDING AND WILL BE REPORTED IN AN ADDENDUM. 2. Lymph nodes, regional resection, left pelvic - SIX LYMPH NODES, NEGATIVE FOR CARCINOMA (0/6). 3. Lymph nodes, regional resection, left para aortic - THREE LYMPH NODES, NEGATIVE FOR CARCINOMA (0/3). 4. 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 - CARCINOMA INVADES FOR A DEPTH OF 1.0 CM WHERE MYOMETRIAL THICKNESS IS 3.0 CM (LESS THAN 50%). - TUMOR DOES NOT INVOLVE CERVIX OF SEROSAL SURFACE. - LYMPHOVASCULAR INVASION IS PRESENT. - BENIGN LEIOMYOMATA. - BENIGN BILATERAL OVARIES AND FALLOPIAN TUBES.  Morgan Vincent presented with symptoms of: Patient developed severe abdominal pains in the abdomen on Sep 18, 2017.  This became worse overnight and she presented to [facility] emergency room department on Sep 19, 2017.  Her pains were crampy and throughout the upper and lower abdomen.  A CT scan of the abdomen and pelvis was performed which revealed mild inflammation involving distal small bowel loops including the terminal ileum with no obstruction, diverticulosis without definite diverticulitis.  No abscess.  The uterus was markedly abnormal with large heterogeneous soft tissues within the endometrial cavity.  There are multiple calcified fibroids identified.  She was presumed to have a diagnosis of gastroenteritis and was discharged home with recommendation to follow-up with her OB/GYN provider for work-up of her abnormal uterine findings.  She saw Dr. Raphael Vincent on May 31st, 2019 and a Pap smear was obtained which revealed atypical glandular cells, HPV negative.  An endometrial biopsy was performed which revealed minute fragments of  malignant neoplasm, favor high-grade carcinoma.  There is small amount of of tissue was too small for further evaluation with immunohistochemistry.     Biopsies revealed:  INVASIVE ENDOMETRIOID ADENOCARCINOMA, FIGO GRADE II  Past/Anticipated interventions by Gyn/Onc surgery, if any: Operation: Robotic-assisted laparoscopic total hysterectomy >250gm with bilateral salpingoophorectomy, SLN biopsy, pelvic and para-aortic lymphadenectomy  Surgeon: Morgan Vincent    Past/Anticipated interventions by medical oncology, if any: consult 10/29/17 with Dr. Alvy Vincent:  I discussed with the patient the role of adjuvant treatment Given recent diagnosis of stage III disease, I recommend CT scan of the chest, abdomen and pelvis to complete staging I recommend port placement, blood draw and chemo education class next week with plan to start her first dose of chemotherapy on the week of July 22 We briefly discussed some of the expected side effects of treatment I gave her prescription of cranial prosthesis    Weight changes, if any: No   Bowel/Bladder complaints, if any: denies any c/o of urinary symptom. Pt reports "its hard to get my bowels to work"  Nausea/Vomiting, if any: No  Pain issues, if any:  No  SAFETY ISSUES:  Prior radiation? No  Pacemaker/ICD? No  Possible current pregnancy? No  Is the patient on methotrexate? No  Current Complaints / other details:  Pt presents today for initial consult with Dr. Sondra Vincent for Radiation Oncology. Pt is accompanied by a large support team.   Morgan Sousa, RN BSN

## 2017-10-29 ENCOUNTER — Ambulatory Visit
Admission: RE | Admit: 2017-10-29 | Discharge: 2017-10-29 | Disposition: A | Discharge status: Home / Self Care | Payer: BLUE CROSS/BLUE SHIELD | Source: Ambulatory Visit | Attending: Radiation Oncology | Admitting: Radiation Oncology

## 2017-10-29 ENCOUNTER — Encounter: Payer: Self-pay | Admitting: Radiation Oncology

## 2017-10-29 ENCOUNTER — Encounter: Payer: Self-pay | Admitting: Hematology and Oncology

## 2017-10-29 ENCOUNTER — Encounter: Payer: Self-pay | Admitting: Oncology

## 2017-10-29 ENCOUNTER — Inpatient Hospital Stay: Payer: BLUE CROSS/BLUE SHIELD | Attending: Gynecologic Oncology | Admitting: Hematology and Oncology

## 2017-10-29 ENCOUNTER — Telehealth: Payer: Self-pay | Admitting: Hematology and Oncology

## 2017-10-29 ENCOUNTER — Other Ambulatory Visit: Payer: Self-pay | Admitting: Hematology and Oncology

## 2017-10-29 ENCOUNTER — Other Ambulatory Visit: Payer: Self-pay

## 2017-10-29 VITALS — BP 125/66 | HR 96 | Temp 98.6°F | Resp 18 | Wt 260.8 lb

## 2017-10-29 VITALS — BP 116/74 | HR 100 | Temp 98.7°F | Resp 18 | Ht 60.5 in | Wt 261.4 lb

## 2017-10-29 DIAGNOSIS — K5790 Diverticulosis of intestine, part unspecified, without perforation or abscess without bleeding: Secondary | ICD-10-CM | POA: Insufficient documentation

## 2017-10-29 DIAGNOSIS — Z79899 Other long term (current) drug therapy: Secondary | ICD-10-CM | POA: Insufficient documentation

## 2017-10-29 DIAGNOSIS — C779 Secondary and unspecified malignant neoplasm of lymph node, unspecified: Secondary | ICD-10-CM | POA: Insufficient documentation

## 2017-10-29 DIAGNOSIS — B372 Candidiasis of skin and nail: Secondary | ICD-10-CM | POA: Diagnosis not present

## 2017-10-29 DIAGNOSIS — C541 Malignant neoplasm of endometrium: Secondary | ICD-10-CM

## 2017-10-29 DIAGNOSIS — Z800 Family history of malignant neoplasm of digestive organs: Secondary | ICD-10-CM | POA: Diagnosis not present

## 2017-10-29 DIAGNOSIS — Z9221 Personal history of antineoplastic chemotherapy: Secondary | ICD-10-CM | POA: Insufficient documentation

## 2017-10-29 DIAGNOSIS — E119 Type 2 diabetes mellitus without complications: Secondary | ICD-10-CM | POA: Diagnosis not present

## 2017-10-29 DIAGNOSIS — Z90722 Acquired absence of ovaries, bilateral: Secondary | ICD-10-CM | POA: Insufficient documentation

## 2017-10-29 DIAGNOSIS — M7989 Other specified soft tissue disorders: Secondary | ICD-10-CM | POA: Diagnosis not present

## 2017-10-29 DIAGNOSIS — C775 Secondary and unspecified malignant neoplasm of intrapelvic lymph nodes: Secondary | ICD-10-CM | POA: Diagnosis not present

## 2017-10-29 DIAGNOSIS — Z923 Personal history of irradiation: Secondary | ICD-10-CM | POA: Insufficient documentation

## 2017-10-29 DIAGNOSIS — Z809 Family history of malignant neoplasm, unspecified: Secondary | ICD-10-CM | POA: Diagnosis not present

## 2017-10-29 DIAGNOSIS — C641 Malignant neoplasm of right kidney, except renal pelvis: Secondary | ICD-10-CM | POA: Diagnosis not present

## 2017-10-29 DIAGNOSIS — Z90710 Acquired absence of both cervix and uterus: Secondary | ICD-10-CM | POA: Insufficient documentation

## 2017-10-29 DIAGNOSIS — N939 Abnormal uterine and vaginal bleeding, unspecified: Secondary | ICD-10-CM | POA: Insufficient documentation

## 2017-10-29 DIAGNOSIS — I10 Essential (primary) hypertension: Secondary | ICD-10-CM | POA: Insufficient documentation

## 2017-10-29 MED ORDER — NYSTATIN-TRIAMCINOLONE 100000-0.1 UNIT/GM-% EX OINT: 1.0000 "application " | TOPICAL_OINTMENT | Freq: Two times a day (BID) | CUTANEOUS | 0 refills | Status: DC

## 2017-10-29 NOTE — Assessment & Plan Note (Signed)
The patient is at risk of uncontrolled diabetes while on treatment We will monitor her blood sugar carefully during treatment

## 2017-10-29 NOTE — Progress Notes (Signed)
Radiation Oncology         [phone] ________________________________  Initial Outpatient Consultation  Name: Morgan Vincent MRN: [id number]  Date: 10/29/2017  DOB: May 29, 1955  [id number], Wells Guiles, MD  Everitt Amber, MD   REFERRING PHYSICIAN: Everitt Amber, MD  DIAGNOSIS: Stage IIIC1 (pT1a, pN1a, cM0) invasive endometrioid adenocarcinoma, grade 2, with LVSI  HISTORY OF PRESENT ILLNESS::Morgan Vincent is a 62 y.o. female who is seen in consultation today at the request of Dr. Denman George for a high grade endometrial cancer. The patient initially presented to [facility] ED with severe worsening abdominal pain on 09/19/17. Her pains were crampy and present throughout the upper and lower abdomen. She was evaluated with a CT scan of the abdomen/pelvis that showed mild inflammation involving the distal small bowel loops including terminal ileum, representing mild infectious or inflammatory enteritis. She had diverticulosis without diverticulitis. The uterus was markedly abnormal with large heterogeneous soft tissue within the endometrial cavity. The soft tissue markedly distends the endometrial cavity. She was presumed to have a diagnosis of gastroenteritis and was discharged home with recommendation to follow up with her OB/GYN provider for work-up of her abnormal uterine findings.  She saw Dr. Raphael Gibney on 09/21/17, and a Pap smear was obtained which revealed atypical glandular cells, HPV negative. An endometrial biopsy was also performed and revealed minute fragments of malignant neoplasm, favor high-grade carcinoma.  She subsequently underwent total hysterectomy with bilateral salpingo-oophorectomy and SLN biopsy by Dr. Denman George on 10/15/17. Final pathology revealed grade 2 invasive endometrioid adenocarcinoma, measuring 8.5 cm, involving the anterior and posterior endometrium. Carcinoma invaded for a depth of 1.0 cm where myometrial thickness was 3.0 cm. Tumor did not involve cervix of serosal surface.  Lymphovascular invasion was present. Leiomyomata, bilateral ovaries and fallopian tubes were benign. Out of 10 lymph nodes biopsied, 1 right obturator node was positive for metastatic adenocarcinoma.  She saw Dr. Alvy Bimler earlier today who recommended CT scan of the chest, abdomen, and pelvis to complete staging. The patient will begin chemotherapy on July 22nd. She presents today accompanied by several friends to discuss the role of adjuvant radiotherapy in the treatment of her endometrial cancer.  On review of systems, the patient reports vaginal bleeding that started after biopsy. She reports mild swelling in her lower left leg. She states that this is normal for her.  PREVIOUS RADIATION THERAPY: No  PAST MEDICAL HISTORY:  has a past medical history of Asthma, Diabetes mellitus without complication ([place]), Fibroids, and Hypertension.    PAST SURGICAL HISTORY: Past Surgical History:  Procedure Laterality Date  . LAPAROTOMY N/A 10/15/2017   Procedure: MINI LAPAROTOMY;  Surgeon: Everitt Amber, MD;  Location: [facility] ORS;  Service: Gynecology;  Laterality: N/A;  . MYOMECTOMY    . ROBOTIC ASSISTED TOTAL HYSTERECTOMY WITH BILATERAL SALPINGO OOPHERECTOMY Bilateral 10/15/2017   Procedure: XI ROBOTIC ASSISTED TOTAL HYSTERECTOMY GREATER THAN 250 GRAMS WITH BILATERAL SALPINGO OOPHORECTOMY, SENTINAL LYMPH NODE BIOPSY , PERI AND PARA- [place] LYMPH NODE DISECTIOIN;  Surgeon: Everitt Amber, MD;  Location: [facility];  Service: Gynecology;  Laterality: Bilateral;    FAMILY HISTORY: family history includes Alzheimer's disease in her mother; Cancer in her maternal aunt; Liver cancer in her father.  SOCIAL HISTORY:  reports that she has never smoked. She has never used smokeless tobacco. She reports that she does not drink alcohol or use drugs. Retired.   ALLERGIES: Patient has no known allergies.  MEDICATIONS:  Current Outpatient Medications  Medication Sig Dispense Refill  . albuterol (PROVENTIL HFA;VENTOLIN HFA)  108  (90 Base) MCG/ACT inhaler Inhale 2 puffs into the lungs every 6 (six) hours as needed for wheezing or shortness of breath.    . Ascorbic Acid (VITAMIN C) 1000 MG tablet Take 1,000 mg by mouth daily.    Marland Kitchen aspirin 81 MG tablet Take 81 mg by mouth daily.    Marland Kitchen CALCIUM PO Take 1 tablet by mouth daily.    Marland Kitchen enoxaparin (LOVENOX) 40 MG/0.4ML injection Inject 0.4 mLs (40 mg total) into the skin daily. 10 Syringe 2  . hydrochlorothiazide (HYDRODIURIL) 50 MG tablet Take 50 mg by mouth daily    . Multiple Vitamins-Minerals (ALIVE WOMENS 50+ PO) Take 1 tablet by mouth daily.    . naproxen sodium (ALEVE) 220 MG tablet Take 220 mg by mouth.    . nystatin-triamcinolone ointment (MYCOLOG) Apply 1 application topically 2 (two) times daily. 30 g 0  . Potassium 99 MG TABS Take 99 mg by mouth daily.    . vitamin E 1000 UNIT capsule Take 1,000 Units by mouth daily.    Marland Kitchen zinc gluconate 50 MG tablet Take 50 mg by mouth daily.    Marland Kitchen dicyclomine (BENTYL) 20 MG tablet Take 20 mg by mouth every 6 (six) hours.     Marland Kitchen oxyCODONE-acetaminophen (PERCOCET/ROXICET) 5-325 MG tablet Take 1 tablet by mouth every 4 (four) hours as needed for moderate pain. (Patient not taking: Reported on 10/29/2017) 30 tablet 0  . PROBIOTIC PRODUCT PO Take 1 capsule by mouth daily.      No current facility-administered medications for this encounter.     REVIEW OF SYSTEMS:  REVIEW OF SYSTEMS: A 10+ POINT REVIEW OF SYSTEMS WAS OBTAINED including neurology, dermatology, psychiatry, cardiac, respiratory, lymph, extremities, GI, GU, musculoskeletal, constitutional, reproductive, HEENT. All pertinent positives are noted in the HPI. All others are negative.   PHYSICAL EXAM:  weight is 260 lb 12.8 oz (118.3 kg). Her oral temperature is 98.6 F (37 C). Her blood pressure is 125/66 and her pulse is 96. Her respiration is 18 and oxygen saturation is 99%.   General: Alert and oriented, in no acute distress. HEENT: Head is normocephalic. Extraocular movements  are intact. Oropharynx is clear. Neck: Neck is supple, no palpable cervical or supraclavicular lymphadenopathy. Heart: Regular in rate and rhythm with no murmurs, rubs, or gallops. Chest: Clear to auscultation bilaterally, with no rhonchi, wheezes, or rales. Abdomen: Soft, nontender, nondistended, with no rigidity or guarding. Extremities: No cyanosis or edema. Lymphatics: see Neck Exam Skin: No concerning lesions. Musculoskeletal: Symmetric strength and muscle tone throughout. Neurologic: Cranial nerves II through XII are grossly intact. No obvious focalities. Speech is fluent. Coordination is intact. Psychiatric: Judgment and insight are intact. Affect is appropriate.  Pelvic exam deferred in light of recent surgery.   ECOG = 1  LABORATORY DATA:  Lab Results  Component Value Date   WBC 8.6 10/16/2017   HGB 11.0 (L) 10/16/2017   HCT 32.1 (L) 10/16/2017   MCV 81.9 10/16/2017   PLT 306 10/16/2017   Lab Results  Component Value Date   NA 140 10/16/2017   K 3.3 (L) 10/16/2017   CL 102 10/16/2017   CO2 30 10/16/2017   GLUCOSE 125 (H) 10/16/2017   CREATININE 0.79 10/16/2017   CALCIUM 8.9 10/16/2017      RADIOGRAPHY: No results found.    IMPRESSION: Stage IIIC1 (pT1a, pN1a, cM0) invasive endometrioid adenocarcinoma, grade 2, with LVSI  Patient would be at risk recurrence in light of her advanced stage. She will proceed  with adjuvant chemotherapy under the direction of Dr. Alvy Bimler.  Pathology report does show LVSI which is one of the predictors for vaginal cuff recurrence. In light of this finding, I would recommend adjuvant brachytherapy treatments to reduce her chances for vaginal cuff recurrence  Today, I talked to the patient and her friends about the findings and work-up thus far.  We discussed the natural history of endometrial cancer and general treatment, highlighting the role of radiotherapy in the management.  We discussed the available radiation techniques, and focused  on the details of logistics and delivery.  We reviewed the anticipated acute and late sequelae associated with radiation in this setting.  The patient was encouraged to ask questions that I answered to the best of my ability.  A patient consent form was discussed and signed.  We retained a copy for our records.  The patient would like to proceed with radiation and will be scheduled for CT simulation.  PLAN:   She will be scheduled to start her brachytherapy during her second or third cycle of chemotherapy. I anticipate 5 brachytherapy treatments on a weekly basis.     ------------------------------------------------  Blair Promise, PhD, MD  This document serves as a record of services personally performed by Gery Pray, MD. It was created on his behalf by Rae Lips, a trained medical scribe. The creation of this record is based on the scribe's personal observations and the provider's statements to them. This document has been checked and approved by the attending provider.

## 2017-10-29 NOTE — Assessment & Plan Note (Signed)
She is noted to have yeast infection on her skin I recommend topical nystatin cream

## 2017-10-29 NOTE — Telephone Encounter (Signed)
Gave avs and calendar ° °

## 2017-10-29 NOTE — Progress Notes (Signed)
START ON PATHWAY REGIMEN - Uterine     A cycle is every 21 days:     Paclitaxel      Carboplatin   **Always confirm dose/schedule in your pharmacy ordering system**  Patient Characteristics: Endometrioid Histology, Newly Diagnosed, Resected, Stage IIIC1/IIIC2 - Grade 1, 2, or 3 Histology: Endometrioid Histology Therapeutic Status: Newly Diagnosed AJCC T Category: T1a AJCC N Category: N1 AJCC M Category: M0 AJCC 8 Stage Grouping: IIIC1 Surgical Status: Resected Intent of Therapy: Curative Intent, Discussed with Patient

## 2017-10-29 NOTE — Assessment & Plan Note (Signed)
I discussed with the patient the role of adjuvant treatment Given recent diagnosis of stage III disease, I recommend CT scan of the chest, abdomen and pelvis to complete staging I recommend port placement, blood draw and chemo education class next week with plan to start her first dose of chemotherapy on the week of July 22 We briefly discussed some of the expected side effects of treatment I gave her prescription of cranial prosthesis

## 2017-10-29 NOTE — Progress Notes (Signed)
[place] CONSULT NOTE  Patient Care Team: [name], MD as PCP - General (Geriatric Medicine) Ena Dawley, MD as Consulting Physician (Obstetrics and Gynecology)  ASSESSMENT & PLAN:  Endometrial cancer [facility]) I discussed with the patient the role of adjuvant treatment Given recent diagnosis of stage III disease, I recommend CT scan of the chest, abdomen and pelvis to complete staging I recommend port placement, blood draw and chemo education class next week with plan to start her first dose of chemotherapy on the week of July 22 We briefly discussed some of the expected side effects of treatment I gave her prescription of cranial prosthesis  Diabetes mellitus without complication ([place]) The patient is at risk of uncontrolled diabetes while on treatment We will monitor her blood sugar carefully during treatment  Yeast infection of the skin She is noted to have yeast infection on her skin I recommend topical nystatin cream   Orders Placed This Encounter  Procedures  . IR IMAGING GUIDED PORT INSERTION    Standing Status:   Future    Standing Expiration Date:   12/31/2018    Order Specific Question:   Reason for Exam (SYMPTOM  OR DIAGNOSIS REQUIRED)    Answer:   need chemo to start 7/22    Order Specific Question:   Preferred Imaging Location?    Answer:   [facility]     CHIEF COMPLAINTS/PURPOSE OF CONSULTATION:  Uterine cancer, for adjuvant treatment  HISTORY OF PRESENTING ILLNESS:  Morgan Vincent 62 y.o. female is here because of recent diagnosis of stage III uterine cancer She is here with multiple friends.  Her closest friend/next of kin is Deon. The patient was found to have locally advanced cancer when she presented to local emergency department complaining of significant abdominal pain.  She denies postmenopausal bleeding.  She reached menopause at around age of 81. She has no children In the local emergency department, she was found  to have abnormal CT imaging.  She was subsequently referred to see her gynecologist with biopsy suspicious for cancer leading to her surgery. I have reviewed her chart and materials related to her cancer extensively and collaborated history with the patient. Summary of oncologic history is as follows: Oncology History   MSI stable     Endometrial cancer ([place])   09/19/2017 Initial Diagnosis    A CT scan of the abdomen and pelvis was performed which revealed mild inflammation involving distal small bowel loops including the terminal ileum with no obstruction, diverticulosis without definite diverticulitis.  No abscess.  The uterus was markedly abnormal with large heterogeneous soft tissues within the endometrial cavity.  There are multiple calcified fibroids identified.  She was presumed to have a diagnosis of gastroenteritis and was discharged home with recommendation to follow-up with her OB/GYN provider for work-up of her abnormal uterine findings.  She saw Dr. Raphael Gibney on May 31st, 2019 and a Pap smear was obtained which revealed atypical glandular cells, HPV negative.  An endometrial biopsy was performed which revealed minute fragments of malignant neoplasm, favor high-grade carcinoma.  There is small amount of of tissue was too small for further evaluation with immunohistochemistry       10/15/2017 Pathology Results    1. Lymph node, sentinel, biopsy, right obturator AMENDED DIAGNOSIS: - METASTATIC ADENOCARCINOMA TO LYMPH NODE (1/1). (CONFIRMED WITH IMMUNOHISTOCHEMICAL STAIN FOR PANKERATIN). 2. Lymph nodes, regional resection, left pelvic - SIX LYMPH NODES, NEGATIVE FOR CARCINOMA (0/6). 3. Lymph nodes, regional resection, left para aortic -  THREE LYMPH NODES, NEGATIVE FOR CARCINOMA (0/3). 4. 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 - CARCINOMA INVADES  FOR A DEPTH OF 1.0 CM WHERE MYOMETRIAL THICKNESS IS 3.0 CM (LESS THAN 50%). - TUMOR DOES NOT INVOLVE CERVIX OF SEROSAL SURFACE. - LYMPHOVASCULAR INVASION IS PRESENT. - BENIGN LEIOMYOMATA. - BENIGN BILATERAL OVARIES AND FALLOPIAN TUBES. - SEE ONCOLOGY TABLE. Microscopic Comment 4. UTERUS, CARCINOMA OR CARCINOSARCOMA Procedure: Total hysterectomy with bilateral salpingo-oophorectomy. Histologic type: Endometrioid adenocarcinoma. Histologic Grade: FIGO grade II. Myometrial invasion: Depth of invasion: 10 mm Myometrial thickness: 30 mm Uterine Serosa Involvement: Not identified. Cervical stromal involvement: Not identified. Extent of involvement of other organs: N/A. Lymphovascular invasion: Present. Regional Lymph Nodes: Examined: 1 Sentinel 9 Non-sentinel 10 Total Lymph nodes with metastasis: 1 Isolated tumor cells: 0 Micrometastasis: 0 Macrometastasis: 1 Extranodal extension: not present. Tumor block for ancillary studies: 37F and 4G. MMR / MSI testing: Pending. Pathologic Stage Classification (pTNM, AJCC 8th edition): pT1a, pN1a. (v4.1.0.0) Diagnosis Note 4. Immunohistochemical stains for MMR-related proteins and molecular studies for microsatellite instability are pending and will be reported in an addendum. (NDK:gt, 10/17/17)       10/15/2017 Surgery    Surgeon: Donaciano Eva    Operation: Robotic-assisted laparoscopic total hysterectomy >250gm with bilateral salpingoophorectomy, SLN biopsy, pelvic and para-aortic lymphadenectomy  Operative Findings:  : 14cm bulky fibroid uterus with adhesions to appendix. Slightly enlarged right obturator SLN. Unilateral mapping to right side.  No gross extrauterine disease.         10/29/2017 Cancer Staging    Staging form: Corpus Uteri - Carcinoma and Carcinosarcoma, AJCC 8th Edition - Pathologic: Stage IIIC1 (pT1a, pN1a, cM0) - Signed by Heath Lark, MD on 10/29/2017      Since surgery, she is feeling well.  She has lost  approximately 8 pounds related to her surgery. Her appetite is stable.  She has mild constipation since surgery but it does not bother her.   MEDICAL HISTORY:  Past Medical History:  Diagnosis Date  . Asthma   . Diabetes mellitus without complication ([place])   . Fibroids   . Hypertension     SURGICAL HISTORY: Past Surgical History:  Procedure Laterality Date  . LAPAROTOMY N/A 10/15/2017   Procedure: MINI LAPAROTOMY;  Surgeon: Everitt Amber, MD;  Location: [facility];  Service: Gynecology;  Laterality: N/A;  . MYOMECTOMY    . ROBOTIC ASSISTED TOTAL HYSTERECTOMY WITH BILATERAL SALPINGO OOPHERECTOMY Bilateral 10/15/2017   Procedure: XI ROBOTIC ASSISTED TOTAL HYSTERECTOMY GREATER THAN 250 GRAMS WITH BILATERAL SALPINGO OOPHORECTOMY, SENTINAL LYMPH NODE BIOPSY , PERI AND PARA- [place] LYMPH NODE DISECTIOIN;  Surgeon: Everitt Amber, MD;  Location: [facility];  Service: Gynecology;  Laterality: Bilateral;    SOCIAL HISTORY: Social History   Socioeconomic History  . Marital status: Single    Spouse name: Not on file  . Number of children: Not on file  . Years of education: Not on file  . Highest education level: Not on file  Occupational History  . Not on file  Social Needs  . Financial resource strain: Not on file  . Food insecurity:    Worry: Not on file    Inability: Not on file  . Transportation needs:    Medical: Not on file    Non-medical: Not on file  Tobacco Use  . Smoking status: Never Smoker  . Smokeless tobacco: Never Used  Substance and Sexual Activity  .  Alcohol use: No  . Drug use: No  . Sexual activity: Not Currently    Partners: Male    Birth control/protection: None  Lifestyle  . Physical activity:    Days per week: Not on file    Minutes per session: Not on file  . Stress: Not on file  Relationships  . Social connections:    Talks on phone: Not on file    Gets together: Not on file    Attends religious service: Not on file    Active member of club or organization:  Not on file    Attends meetings of clubs or organizations: Not on file    Relationship status: Not on file  . Intimate partner violence:    Fear of current or ex partner: Not on file    Emotionally abused: Not on file    Physically abused: Not on file    Forced sexual activity: Not on file  Other Topics Concern  . Not on file  Social History Narrative  . Not on file    FAMILY HISTORY: Family History  Problem Relation Age of Onset  . Alzheimer's disease Mother   . Liver cancer Father   . Cancer Maternal Aunt     ALLERGIES:  has No Known Allergies.  MEDICATIONS:  Current Outpatient Medications  Medication Sig Dispense Refill  . albuterol (PROVENTIL HFA;VENTOLIN HFA) 108 (90 Base) MCG/ACT inhaler Inhale 2 puffs into the lungs every 6 (six) hours as needed for wheezing or shortness of breath.    . Ascorbic Acid (VITAMIN C) 1000 MG tablet Take 1,000 mg by mouth daily.    Marland Kitchen aspirin 81 MG tablet Take 81 mg by mouth daily.    Marland Kitchen CALCIUM PO Take 1 tablet by mouth daily.    Marland Kitchen dicyclomine (BENTYL) 20 MG tablet Take 20 mg by mouth every 6 (six) hours.     . enoxaparin (LOVENOX) 40 MG/0.4ML injection Inject 0.4 mLs (40 mg total) into the skin daily. 10 Syringe 2  . hydrochlorothiazide (HYDRODIURIL) 50 MG tablet Take 50 mg by mouth daily    . Multiple Vitamins-Minerals (ALIVE WOMENS 50+ PO) Take 1 tablet by mouth daily.    . naproxen sodium (ALEVE) 220 MG tablet Take 220 mg by mouth.    . nystatin-triamcinolone ointment (MYCOLOG) Apply 1 application topically 2 (two) times daily. 30 g 0  . oxyCODONE-acetaminophen (PERCOCET/ROXICET) 5-325 MG tablet Take 1 tablet by mouth every 4 (four) hours as needed for moderate pain. 30 tablet 0  . Potassium 99 MG TABS Take 99 mg by mouth daily.    Marland Kitchen PROBIOTIC PRODUCT PO Take 1 capsule by mouth daily.     . vitamin E 1000 UNIT capsule Take 1,000 Units by mouth daily.    Marland Kitchen zinc gluconate 50 MG tablet Take 50 mg by mouth daily.     No current  facility-administered medications for this visit.     REVIEW OF SYSTEMS:   Constitutional: Denies fevers, chills or abnormal night sweats Eyes: Denies blurriness of vision, double vision or watery eyes Ears, nose, mouth, throat, and face: Denies mucositis or sore throat Respiratory: Denies cough, dyspnea or wheezes Cardiovascular: Denies palpitation, chest discomfort or lower extremity swelling Lymphatics: Denies new lymphadenopathy or easy bruising Neurological:Denies numbness, tingling or new weaknesses Behavioral/Psych: Mood is stable, no new changes  All other systems were reviewed with the patient and are negative.  PHYSICAL EXAMINATION: ECOG PERFORMANCE STATUS: 1 - Symptomatic but completely ambulatory  Vitals:   10/29/17  1130  BP: 116/74  Pulse: 100  Resp: 18  Temp: 98.7 F (37.1 C)  SpO2: 100%   Filed Weights   10/29/17 1130  Weight: 261 lb 6.4 oz (118.6 kg)    GENERAL:alert, no distress and comfortable SKIN: Noted yeast infection beneath her skin full and near the periumbilical region EYES: normal, conjunctiva are pink and non-injected, sclera clear OROPHARYNX:no exudate, no erythema and lips, buccal mucosa, and tongue normal  NECK: supple, thyroid normal size, non-tender, without nodularity LYMPH:  no palpable lymphadenopathy in the cervical, axillary or inguinal LUNGS: clear to auscultation and percussion with normal breathing effort HEART: regular rate & rhythm and no murmurs and no lower extremity edema ABDOMEN:abdomen soft, non-tender and normal bowel sounds.  Well-healed surgical scar Musculoskeletal:no cyanosis of digits and no clubbing  PSYCH: alert & oriented x 3 with fluent speech NEURO: no focal motor/sensory deficits  LABORATORY DATA:  I have reviewed the data as listed Lab Results  Component Value Date   WBC 8.6 10/16/2017   HGB 11.0 (L) 10/16/2017   HCT 32.1 (L) 10/16/2017   MCV 81.9 10/16/2017   PLT 306 10/16/2017   Recent Labs     10/10/17 1218 10/16/17 0417  NA 141 140  K 3.2* 3.3*  CL 101 102  CO2 28 30  GLUCOSE 113* 125*  BUN 13 11  CREATININE 0.76 0.79  CALCIUM 9.8 8.9  GFRNONAA >60 >60  GFRAA >60 >60  PROT 7.9  --   ALBUMIN 4.2  --   AST 24  --   ALT 23  --   ALKPHOS 68  --   BILITOT 0.7  --   I spent 55 minutes counseling the patient face to face. The total time spent in the appointment was 60 minutes and more than 50% was on counseling.  All questions were answered. The patient knows to call the clinic with any problems, questions or concerns.  Heath Lark, MD 10/29/2017 12:25 PM

## 2017-10-30 ENCOUNTER — Telehealth: Payer: Self-pay | Admitting: Oncology

## 2017-10-30 NOTE — Telephone Encounter (Signed)
Left a message for patient advising her that she will need to pick up contrast at the cancer center or radiology.  Let her know that she should be receiving a call when the CT scan is scheduled.

## 2017-10-31 ENCOUNTER — Telehealth: Payer: Self-pay | Admitting: *Deleted

## 2017-10-31 NOTE — Telephone Encounter (Signed)
Port placement is being scheduled at [facility]. [facility] is full until after her first chemo

## 2017-11-01 ENCOUNTER — Telehealth: Payer: Self-pay | Admitting: Oncology

## 2017-11-01 NOTE — Telephone Encounter (Signed)
Notified patient of CT chest, abdomen and pelvis scheduled at 12 pm on 11/06/17 at [facility].  Advised her to pick up 2 bottles of contrast at the cancer center or radiology department.  Also advised her not eat or drink anything 4 hours before the scan and to drink the first bottle of contrast at 10 am on 7/16 and the second bottle at 11 am.  She verbalized understanding and agreement.

## 2017-11-05 MED FILL — ENOXAPARIN 40 MG/0.4 ML SYR: 40 | 10 days supply | Qty: 4 | Fill #2

## 2017-11-06 ENCOUNTER — Ambulatory Visit (HOSPITAL_COMMUNITY)
Admission: RE | Admit: 2017-11-06 | Discharge: 2017-11-06 | Disposition: A | Discharge status: Home / Self Care | Payer: BLUE CROSS/BLUE SHIELD | Source: Ambulatory Visit | Attending: Hematology and Oncology | Admitting: Hematology and Oncology

## 2017-11-06 ENCOUNTER — Other Ambulatory Visit: Payer: Self-pay | Admitting: Hematology and Oncology

## 2017-11-06 ENCOUNTER — Encounter (HOSPITAL_COMMUNITY): Payer: Self-pay | Admitting: Radiology

## 2017-11-06 DIAGNOSIS — C541 Malignant neoplasm of endometrium: Secondary | ICD-10-CM | POA: Insufficient documentation

## 2017-11-06 MED ORDER — IOPAMIDOL (ISOVUE-300) INJECTION 61%
INTRAVENOUS | Status: AC
  Filled 2017-11-06: qty 100

## 2017-11-06 MED ORDER — IOPAMIDOL (ISOVUE-300) INJECTION 61%
100.0000 mL | Freq: Once | INTRAVENOUS | Status: AC | PRN
  Administered 2017-11-06: 100 mL via INTRAVENOUS

## 2017-11-07 ENCOUNTER — Encounter: Payer: Self-pay | Admitting: Gynecologic Oncology

## 2017-11-07 ENCOUNTER — Telehealth: Payer: Self-pay | Admitting: Hematology and Oncology

## 2017-11-07 ENCOUNTER — Encounter: Payer: Self-pay | Admitting: Hematology and Oncology

## 2017-11-07 ENCOUNTER — Inpatient Hospital Stay (HOSPITAL_BASED_OUTPATIENT_CLINIC_OR_DEPARTMENT_OTHER): Payer: BLUE CROSS/BLUE SHIELD | Admitting: Gynecologic Oncology

## 2017-11-07 ENCOUNTER — Inpatient Hospital Stay: Payer: BLUE CROSS/BLUE SHIELD

## 2017-11-07 ENCOUNTER — Inpatient Hospital Stay: Payer: BLUE CROSS/BLUE SHIELD | Admitting: Hematology and Oncology

## 2017-11-07 ENCOUNTER — Other Ambulatory Visit: Payer: Self-pay | Admitting: Radiology

## 2017-11-07 ENCOUNTER — Other Ambulatory Visit: Payer: Self-pay | Admitting: Student

## 2017-11-07 ENCOUNTER — Other Ambulatory Visit: Payer: Self-pay | Admitting: Hematology and Oncology

## 2017-11-07 ENCOUNTER — Encounter: Payer: Self-pay | Admitting: Oncology

## 2017-11-07 VITALS — BP 136/94 | HR 95 | Temp 98.1°F | Resp 20 | Ht 65.0 in | Wt 258.8 lb

## 2017-11-07 VITALS — BP 136/80 | HR 90 | Temp 98.4°F | Resp 18 | Ht 65.0 in | Wt 258.1 lb

## 2017-11-07 DIAGNOSIS — K5909 Other constipation: Secondary | ICD-10-CM | POA: Diagnosis not present

## 2017-11-07 DIAGNOSIS — Z9221 Personal history of antineoplastic chemotherapy: Secondary | ICD-10-CM

## 2017-11-07 DIAGNOSIS — C775 Secondary and unspecified malignant neoplasm of intrapelvic lymph nodes: Secondary | ICD-10-CM

## 2017-11-07 DIAGNOSIS — Z7189 Other specified counseling: Secondary | ICD-10-CM

## 2017-11-07 DIAGNOSIS — Z90722 Acquired absence of ovaries, bilateral: Secondary | ICD-10-CM

## 2017-11-07 DIAGNOSIS — C541 Malignant neoplasm of endometrium: Secondary | ICD-10-CM

## 2017-11-07 DIAGNOSIS — Z923 Personal history of irradiation: Secondary | ICD-10-CM

## 2017-11-07 DIAGNOSIS — Z90710 Acquired absence of both cervix and uterus: Secondary | ICD-10-CM

## 2017-11-07 DIAGNOSIS — E119 Type 2 diabetes mellitus without complications: Secondary | ICD-10-CM

## 2017-11-07 LAB — CBC WITH DIFFERENTIAL (CANCER CENTER ONLY)
BASOS ABS: 0 10*3/uL (ref 0.0–0.1)
Basophils Relative: 0 %
EOS PCT: 3 %
Eosinophils Absolute: 0.2 10*3/uL (ref 0.0–0.5)
HEMATOCRIT: 36 % (ref 34.8–46.6)
Hemoglobin: 12.2 g/dL (ref 11.6–15.9)
LYMPHS PCT: 31 %
Lymphs Abs: 2.1 10*3/uL (ref 0.9–3.3)
MCH: 27.4 pg (ref 25.1–34.0)
MCHC: 33.9 g/dL (ref 31.5–36.0)
MCV: 80.7 fL (ref 79.5–101.0)
MONO ABS: 0.5 10*3/uL (ref 0.1–0.9)
Monocytes Relative: 8 %
NEUTROS ABS: 4 10*3/uL (ref 1.5–6.5)
NEUTROS PCT: 58 %
PLATELETS: 334 10*3/uL (ref 145–400)
RBC: 4.46 MIL/uL (ref 3.70–5.45)
RDW: 13.9 % (ref 11.2–14.5)
WBC Count: 6.7 10*3/uL (ref 3.9–10.3)

## 2017-11-07 LAB — CMP (CANCER CENTER ONLY)
ALBUMIN: 4.1 g/dL (ref 3.5–5.0)
ALK PHOS: 89 U/L (ref 38–126)
ALT: 20 U/L (ref 0–44)
ANION GAP: 9 (ref 5–15)
AST: 15 U/L (ref 15–41)
BILIRUBIN TOTAL: 0.3 mg/dL (ref 0.3–1.2)
BUN: 14 mg/dL (ref 8–23)
CALCIUM: 10.3 mg/dL (ref 8.9–10.3)
CO2: 29 mmol/L (ref 22–32)
Chloride: 102 mmol/L (ref 98–111)
Creatinine: 0.81 mg/dL (ref 0.44–1.00)
GFR, Est AFR Am: >60 mL/min (ref >60)
GLUCOSE: 119 mg/dL — AB (ref 70–99)
POTASSIUM: 3.1 mmol/L — AB (ref 3.5–5.1)
Sodium: 140 mmol/L (ref 135–145)
TOTAL PROTEIN: 8.3 g/dL — AB (ref 6.5–8.1)

## 2017-11-07 MED ORDER — LIDOCAINE-PRILOCAINE 2.5-2.5 % EX CREA: TOPICAL_CREAM | CUTANEOUS | 3 refills | Status: DC

## 2017-11-07 MED ORDER — ONDANSETRON HCL 8 MG PO TABS: 8.0000 mg | ORAL_TABLET | Freq: Three times a day (TID) | ORAL | 1 refills | Status: DC | PRN

## 2017-11-07 MED ORDER — PROCHLORPERAZINE MALEATE 10 MG PO TABS: 10.0000 mg | ORAL_TABLET | Freq: Four times a day (QID) | ORAL | 1 refills | Status: DC | PRN

## 2017-11-07 MED ORDER — DEXAMETHASONE 4 MG PO TABS: ORAL_TABLET | ORAL | 0 refills | Status: DC

## 2017-11-07 NOTE — Assessment & Plan Note (Addendum)
I have reviewed recent CT imaging which showed no evidence of residual disease We reviewed the NCCN guidelines We discussed the role of chemotherapy. The intent is of curative intent.  We discussed some of the risks, benefits, side-effects of carboplatin & Taxol. Treatment is intravenous, every 3 weeks x 6 cycles  Some of the short term side-effects included, though not limited to, including weight loss, life threatening infections, risk of allergic reactions, need for transfusions of blood products, nausea, vomiting, change in bowel habits, loss of hair, admission to hospital for various reasons, and risks of death.   Long term side-effects are also discussed including risks of infertility, permanent damage to nerve function, hearing loss, chronic fatigue, kidney damage with possibility needing hemodialysis, and rare secondary malignancy including bone marrow disorders.  The patient is aware that the response rates discussed earlier is not guaranteed.  After a long discussion, patient made an informed decision to proceed with the prescribed plan of care.   Patient education material was dispensed. We discussed premedication with dexamethasone before chemotherapy. I plan to reduce oral premedication due to baseline hyperglycemia/diabetes Due to large BMI and anticipated significant toxicity, the calculated dose of chemotherapy is very high I will reduce the dose of Taxol by 20% and limit maximum dose of carboplatin at 750 mg for each cycle

## 2017-11-07 NOTE — Telephone Encounter (Signed)
Per 7/17 los, no new orders or referrals

## 2017-11-07 NOTE — Assessment & Plan Note (Addendum)
The patient is at risk of uncontrolled diabetes while on treatment We will monitor her blood sugar carefully during treatment I recommend reduced dose oral premedication dexamethasone a little bit

## 2017-11-07 NOTE — Assessment & Plan Note (Signed)
We discussed aggressive laxative therapy 

## 2017-11-07 NOTE — Progress Notes (Signed)
[place] OFFICE PROGRESS NOTE  Patient Care Team: [name], MD as PCP - General (Geriatric Medicine) Ena Dawley, MD as Consulting Physician (Obstetrics and Gynecology)  ASSESSMENT & PLAN:  Endometrial cancer [facility]) I have reviewed recent CT imaging which showed no evidence of residual disease We reviewed the NCCN guidelines We discussed the role of chemotherapy. The intent is of curative intent.  We discussed some of the risks, benefits, side-effects of carboplatin & Taxol. Treatment is intravenous, every 3 weeks x 6 cycles  Some of the short term side-effects included, though not limited to, including weight loss, life threatening infections, risk of allergic reactions, need for transfusions of blood products, nausea, vomiting, change in bowel habits, loss of hair, admission to hospital for various reasons, and risks of death.   Long term side-effects are also discussed including risks of infertility, permanent damage to nerve function, hearing loss, chronic fatigue, kidney damage with possibility needing hemodialysis, and rare secondary malignancy including bone marrow disorders.  The patient is aware that the response rates discussed earlier is not guaranteed.  After a long discussion, patient made an informed decision to proceed with the prescribed plan of care.   Patient education material was dispensed. We discussed premedication with dexamethasone before chemotherapy. I plan to reduce oral premedication due to baseline hyperglycemia/diabetes Due to large BMI and anticipated significant toxicity, the calculated dose of chemotherapy is very high I will reduce the dose of Taxol by 20% and limit maximum dose of carboplatin at 750 mg for each cycle   Diabetes mellitus without complication (HCC) The patient is at risk of uncontrolled diabetes while on treatment We will monitor her blood sugar carefully during treatment I recommend reduced dose oral  premedication dexamethasone a little bit  Other constipation We discussed aggressive laxative therapy   No orders of the defined types were placed in this encounter.   INTERVAL HISTORY: Please see below for problem oriented charting. She returns for chemotherapy consent She feels well Her wound is healing well She has some minor bruising from Lovenox injection  SUMMARY OF ONCOLOGIC HISTORY: Oncology History   MSI stable Endometrioid     Endometrial cancer ([place])   09/19/2017 Initial Diagnosis    A CT scan of the abdomen and pelvis was performed which revealed mild inflammation involving distal small bowel loops including the terminal ileum with no obstruction, diverticulosis without definite diverticulitis.  No abscess.  The uterus was markedly abnormal with large heterogeneous soft tissues within the endometrial cavity.  There are multiple calcified fibroids identified.  She was presumed to have a diagnosis of gastroenteritis and was discharged home with recommendation to follow-up with her OB/GYN provider for work-up of her abnormal uterine findings.  She saw Dr. Raphael Gibney on May 31st, 2019 and a Pap smear was obtained which revealed atypical glandular cells, HPV negative.  An endometrial biopsy was performed which revealed minute fragments of malignant neoplasm, favor high-grade carcinoma.  There is small amount of of tissue was too small for further evaluation with immunohistochemistry       10/15/2017 Pathology Results    1. Lymph node, sentinel, biopsy, right obturator AMENDED DIAGNOSIS: - METASTATIC ADENOCARCINOMA TO LYMPH NODE (1/1). (CONFIRMED WITH IMMUNOHISTOCHEMICAL STAIN FOR PANKERATIN). 2. Lymph nodes, regional resection, left pelvic - SIX LYMPH NODES, NEGATIVE FOR CARCINOMA (0/6). 3. Lymph nodes, regional resection, left para aortic - THREE LYMPH NODES, NEGATIVE FOR CARCINOMA (0/3). 4. Uterus +/- tubes/ovaries, neoplastic, cervix, bilateral tubes and ovaries - INVASIVE  ENDOMETRIOID ADENOCARCINOMA,  FIGO GRADE II, 8.5 CM, INVOLVING ANTERIOR AND POSTERIOR ENDOMETRIUM. - CARCINOMA INVADES FOR A DEPTH OF 1.0 CM WHERE MYOMETRIAL THICKNESS IS 3.0 CM (LESS THAN 50%). - TUMOR DOES NOT INVOLVE CERVIX OF SEROSAL SURFACE. - LYMPHOVASCULAR INVASION IS PRESENT. - BENIGN LEIOMYOMATA. - BENIGN BILATERAL OVARIES AND FALLOPIAN TUBES. - SEE ONCOLOGY TABLE. Microscopic Comment 4. UTERUS, CARCINOMA OR CARCINOSARCOMA Procedure: Total hysterectomy with bilateral salpingo-oophorectomy. Histologic type: Endometrioid adenocarcinoma. Histologic Grade: FIGO grade II. Myometrial invasion: Depth of invasion: 10 mm Myometrial thickness: 30 mm Uterine Serosa Involvement: Not identified. Cervical stromal involvement: Not identified. Extent of involvement of other organs: N/A. Lymphovascular invasion: Present. Regional Lymph Nodes: Examined: 1 Sentinel 9 Non-sentinel 10 Total Lymph nodes with metastasis: 1 Isolated tumor cells: 0 Micrometastasis: 0 Macrometastasis: 1 Extranodal extension: not present. Tumor block for ancillary studies: 93F and 4G. MMR / MSI testing: Pending. Pathologic Stage Classification (pTNM, AJCC 8th edition): pT1a, pN1a. (v4.1.0.0) Diagnosis Note 4. Immunohistochemical stains for MMR-related proteins and molecular studies for microsatellite instability are pending and will be reported in an addendum. (NDK:gt, 10/17/17)       10/15/2017 Surgery    Surgeon: Donaciano Eva    Operation: Robotic-assisted laparoscopic total hysterectomy >250gm with bilateral salpingoophorectomy, SLN biopsy, pelvic and para-aortic lymphadenectomy  Operative Findings:  : 14cm bulky fibroid uterus with adhesions to appendix. Slightly enlarged right obturator SLN. Unilateral mapping to right side.  No gross extrauterine disease.         10/29/2017 Cancer Staging    Staging form: Corpus Uteri - Carcinoma and Carcinosarcoma, AJCC 8th Edition - Pathologic: Stage  IIIC1 (pT1a, pN1a, cM0) - Signed by Heath Lark, MD on 10/29/2017      11/06/2017 Imaging    Expected postop changes from TAH-BSO. No complication identified. No evidence of residual or metastatic carcinoma.       REVIEW OF SYSTEMS:   Constitutional: Denies fevers, chills or abnormal weight loss Eyes: Denies blurriness of vision Ears, nose, mouth, throat, and face: Denies mucositis or sore throat Respiratory: Denies cough, dyspnea or wheezes Cardiovascular: Denies palpitation, chest discomfort or lower extremity swelling Gastrointestinal:  Denies nausea, heartburn or change in bowel habits Skin: Denies abnormal skin rashes Lymphatics: Denies new lymphadenopathy Neurological:Denies numbness, tingling or new weaknesses Behavioral/Psych: Mood is stable, no new changes  All other systems were reviewed with the patient and are negative.  I have reviewed the past medical history, past surgical history, social history and family history with the patient and they are unchanged from previous note.  ALLERGIES:  has No Known Allergies.  MEDICATIONS:  Current Outpatient Medications  Medication Sig Dispense Refill  . albuterol (PROVENTIL HFA;VENTOLIN HFA) 108 (90 Base) MCG/ACT inhaler Inhale 2 puffs into the lungs every 6 (six) hours as needed for wheezing or shortness of breath.    . Ascorbic Acid (VITAMIN C) 1000 MG tablet Take 1,000 mg by mouth daily.    Marland Kitchen aspirin 81 MG tablet Take 81 mg by mouth daily.    Marland Kitchen CALCIUM PO Take 1 tablet by mouth daily.    Marland Kitchen dexamethasone (DECADRON) 4 MG tablet Take 3 tabs at the night before and 3 tabs the morning of chemotherapy, every 3 weeks, by mouth 36 tablet 0  . enoxaparin (LOVENOX) 40 MG/0.4ML injection Inject 0.4 mLs (40 mg total) into the skin daily. 10 Syringe 2  . hydrochlorothiazide (HYDRODIURIL) 50 MG tablet Take 50 mg by mouth daily    . lidocaine-prilocaine (EMLA) cream Apply to affected area once 30 g 3  .  Multiple Vitamins-Minerals (ALIVE WOMENS  50+ PO) Take 1 tablet by mouth daily.    Marland Kitchen nystatin-triamcinolone ointment (MYCOLOG) Apply 1 application topically 2 (two) times daily. 30 g 0  . ondansetron (ZOFRAN) 8 MG tablet Take 1 tablet (8 mg total) by mouth every 8 (eight) hours as needed for refractory nausea / vomiting. Start on day 3 after chemo. 30 tablet 1  . oxyCODONE-acetaminophen (PERCOCET/ROXICET) 5-325 MG tablet Take 1 tablet by mouth every 4 (four) hours as needed for moderate pain. (Patient not taking: Reported on 10/29/2017) 30 tablet 0  . Potassium 99 MG TABS Take 99 mg by mouth daily.    Marland Kitchen PROBIOTIC PRODUCT PO Take 1 capsule by mouth daily.     . prochlorperazine (COMPAZINE) 10 MG tablet Take 1 tablet (10 mg total) by mouth every 6 (six) hours as needed (Nausea or vomiting). 30 tablet 1  . vitamin E 1000 UNIT capsule Take 1,000 Units by mouth daily.    Marland Kitchen zinc gluconate 50 MG tablet Take 50 mg by mouth daily.     No current facility-administered medications for this visit.     PHYSICAL EXAMINATION: ECOG PERFORMANCE STATUS: 0 - Asymptomatic  Vitals:   11/07/17 1210  BP: 136/80  Pulse: 90  Resp: 18  Temp: 98.4 F (36.9 C)  SpO2: 100%   Filed Weights   11/07/17 1210  Weight: 258 lb 1.6 oz (117.1 kg)    GENERAL:alert, no distress and comfortable NEURO: alert & oriented x 3 with fluent speech, no focal motor/sensory deficits  LABORATORY DATA:  I have reviewed the data as listed    Component Value Date/Time   NA 140 10/16/2017 0417   K 3.3 (L) 10/16/2017 0417   CL 102 10/16/2017 0417   CO2 30 10/16/2017 0417   GLUCOSE 125 (H) 10/16/2017 0417   BUN 11 10/16/2017 0417   CREATININE 0.79 10/16/2017 0417   CALCIUM 8.9 10/16/2017 0417   PROT 7.9 10/10/2017 1218   ALBUMIN 4.2 10/10/2017 1218   AST 24 10/10/2017 1218   ALT 23 10/10/2017 1218   ALKPHOS 68 10/10/2017 1218   BILITOT 0.7 10/10/2017 1218   GFRNONAA >60 10/16/2017 0417   GFRAA >60 10/16/2017 0417    No results found for: SPEP, UPEP  Lab  Results  Component Value Date   WBC 8.6 10/16/2017   HGB 11.0 (L) 10/16/2017   HCT 32.1 (L) 10/16/2017   MCV 81.9 10/16/2017   PLT 306 10/16/2017      Chemistry      Component Value Date/Time   NA 140 10/16/2017 0417   K 3.3 (L) 10/16/2017 0417   CL 102 10/16/2017 0417   CO2 30 10/16/2017 0417   BUN 11 10/16/2017 0417   CREATININE 0.79 10/16/2017 0417      Component Value Date/Time   CALCIUM 8.9 10/16/2017 0417   ALKPHOS 68 10/10/2017 1218   AST 24 10/10/2017 1218   ALT 23 10/10/2017 1218   BILITOT 0.7 10/10/2017 1218       RADIOGRAPHIC STUDIES: I have personally reviewed the radiological images as listed and agreed with the findings in the report. Ct Chest W Contrast  Result Date: 11/06/2017 CLINICAL DATA:  Newly diagnosed endometrial carcinoma. Staging. One month status post TAH-BSO. EXAM: CT CHEST, ABDOMEN, AND PELVIS WITH CONTRAST TECHNIQUE: Multidetector CT imaging of the chest, abdomen and pelvis was performed following the standard protocol during bolus administration of intravenous contrast. CONTRAST:  14m ISOVUE-300 IOPAMIDOL (ISOVUE-300) INJECTION 61% COMPARISON:  AP CT on  09/19/2017 FINDINGS: CT CHEST FINDINGS Cardiovascular: No acute findings. Mediastinum/Lymph Nodes: No masses or pathologically enlarged lymph nodes identified. Lungs/Pleura: No pulmonary infiltrate or mass identified. No effusion present. Musculoskeletal:  No suspicious bone lesions identified. CT ABDOMEN AND PELVIS FINDINGS Hepatobiliary: No masses identified. Gallbladder is unremarkable. Pancreas:  No mass or inflammatory changes. Spleen:  Within normal limits in size and appearance. Adrenals/Urinary tract:  No masses or hydronephrosis. Stomach/Bowel: No evidence of obstruction, inflammatory process, or abnormal fluid collections. Vascular/Lymphatic: No pathologically enlarged lymph nodes identified. No abdominal aortic aneurysm. Reproductive: Expected postop changes seen from recent hysterectomy. No  pelvic mass, inflammatory process, or abnormal fluid collections identified. Other:  None. Musculoskeletal:  No suspicious bone lesions identified. IMPRESSION: Expected postop changes from TAH-BSO. No complication identified. No evidence of residual or metastatic carcinoma. Electronically Signed   By: Earle Gell M.D.   On: 11/06/2017 16:07   Ct Abdomen Pelvis W Contrast  Result Date: 11/06/2017 CLINICAL DATA:  Newly diagnosed endometrial carcinoma. Staging. One month status post TAH-BSO. EXAM: CT CHEST, ABDOMEN, AND PELVIS WITH CONTRAST TECHNIQUE: Multidetector CT imaging of the chest, abdomen and pelvis was performed following the standard protocol during bolus administration of intravenous contrast. CONTRAST:  173m ISOVUE-300 IOPAMIDOL (ISOVUE-300) INJECTION 61% COMPARISON:  AP CT on 09/19/2017 FINDINGS: CT CHEST FINDINGS Cardiovascular: No acute findings. Mediastinum/Lymph Nodes: No masses or pathologically enlarged lymph nodes identified. Lungs/Pleura: No pulmonary infiltrate or mass identified. No effusion present. Musculoskeletal:  No suspicious bone lesions identified. CT ABDOMEN AND PELVIS FINDINGS Hepatobiliary: No masses identified. Gallbladder is unremarkable. Pancreas:  No mass or inflammatory changes. Spleen:  Within normal limits in size and appearance. Adrenals/Urinary tract:  No masses or hydronephrosis. Stomach/Bowel: No evidence of obstruction, inflammatory process, or abnormal fluid collections. Vascular/Lymphatic: No pathologically enlarged lymph nodes identified. No abdominal aortic aneurysm. Reproductive: Expected postop changes seen from recent hysterectomy. No pelvic mass, inflammatory process, or abnormal fluid collections identified. Other:  None. Musculoskeletal:  No suspicious bone lesions identified. IMPRESSION: Expected postop changes from TAH-BSO. No complication identified. No evidence of residual or metastatic carcinoma. Electronically Signed   By: JEarle GellM.D.   On:  11/06/2017 16:07    All questions were answered. The patient knows to call the clinic with any problems, questions or concerns. No barriers to learning was detected.  I spent 25 minutes counseling the patient face to face. The total time spent in the appointment was 30 minutes and more than 50% was on counseling and review of test results  NHeath Lark MD 11/07/2017 12:29 PM

## 2017-11-07 NOTE — Patient Instructions (Signed)
Please return to see Dr Alvy Bimler and Dr Sondra Come as scheduled for chemotherapy.  You can finish up your lovenox shots then will not need additional shots.

## 2017-11-07 NOTE — Progress Notes (Signed)
Follow-up Note: Gyn-Onc  Consult was requested by Dr. Raphael Gibney for the evaluation of Morgan Vincent 62 y.o. female  CC:  Chief Complaint  Patient presents with  . endometrial cancer    Assessment/Plan:  Morgan Vincent  is a 62 y.o.  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  Recommend chemotherapy with 6 cycles carboplatin and paclitaxel and vaginal brachytherapy for systemic and local control. She is cleared to commence therapy at this time.  I will see her back for long term follow-up after this.  We discussed her pathology, prognosis and reasoning behind the prescription of adjuvant therapy.   HPI: Morgan Vincent is a 62 year old woman who is seen in consultation at the request of Dr Raphael Gibney for a high grade endometrial cancer.  Patient developed severe abdominal pains in the abdomen on Sep 18, 2017.  This became worse overnight and she presented to [facility] emergency room department on Sep 19, 2017.  Her pains were crampy and throughout the upper and lower abdomen.  A CT scan of the abdomen and pelvis was performed which revealed mild inflammation involving distal small bowel loops including the terminal ileum with no obstruction, diverticulosis without definite diverticulitis.  No abscess.  The uterus was markedly abnormal with large heterogeneous soft tissues within the endometrial cavity.  There are multiple calcified fibroids identified.  She was presumed to have a diagnosis of gastroenteritis and was discharged home with recommendation to follow-up with her OB/GYN provider for work-up of her abnormal uterine findings.  She saw Dr. Raphael Gibney on May 31st, 2019 and a Pap smear was obtained which revealed atypical glandular cells, HPV negative.  An endometrial biopsy was performed which revealed minute fragments of malignant neoplasm, favor high-grade carcinoma.  There is small amount of of tissue was too small for  further evaluation with immunohistochemistry.  I recommendation to correlate with clinical findings is suggested.  The patient is obese, she has a history of hypertension, and asthma.  She has had a prior laparoscopic fibroid surgery (possible myomectomy).  She is nulliparous.  She had no other abdominal surgeries.  Her last menstrual period was in her 24s and she has had no postmenopausal bleeding until she had a biopsy which diagnosed her endometrial cancer.  Her family history significant for father who died of liver cancer, a paternal uncle who died of cancer of unknown origin, and a maternal aunt who died of cancer of unknown origin.  The patient has been diagnosed with diabetes, however she is not treated with medication for this.  Her last colonoscopy was 2 3 years ago.  She had a normal Pap smear in 2017.  Interval Hx:  On 10/15/17 she underwent robotic assisted total hysterectomy >250gm, BSO, SLN biopsy and right pelvic and para-aortic lymphadenectomy. A minilaparotomy was necessary to delivery the 350gm specimen. Additional sutures were placed at the introitus for bleeding at that site due to manipulator placement.  Postop she did well though she developed left genitofemoral nerve dysfunction.  Final pathology revealed a 8.2cm grade 2 endometrioid endometrial cancer with deep myometrial invasion (outer half) and LVSI was present. The right obturator SLN was positive for macrometastatic disease, though the left pelvic and para-aortic lymphadenectomy specimens were free of disease. She was staged as IIIC1 grade 2 endometrioid endometrial adenocarcinoma and adjuvant carboplatin and paclitaxel chemotherapy x 6 cycles with vaginal brachytherapy was prescribed in accordance with NCCN guidelines.  Current Meds:  Outpatient  Encounter Medications as of 11/07/2017  Medication Sig  . acetaminophen (TYLENOL) 325 MG tablet Take 650 mg by mouth every 6 (six) hours as needed for mild pain.  Marland Kitchen albuterol  (PROVENTIL HFA;VENTOLIN HFA) 108 (90 Base) MCG/ACT inhaler Inhale 2 puffs into the lungs every 6 (six) hours as needed for wheezing or shortness of breath.  . Ascorbic Acid (VITAMIN C) 1000 MG tablet Take 1,000 mg by mouth daily.  Marland Kitchen aspirin 81 MG tablet Take 81 mg by mouth daily.  Marland Kitchen CALCIUM PO Take 1 tablet by mouth daily.  Marland Kitchen dexamethasone (DECADRON) 4 MG tablet Take 3 tabs at the night before and 3 tabs the morning of chemotherapy, every 3 weeks, by mouth  . enoxaparin (LOVENOX) 40 MG/0.4ML injection Inject 0.4 mLs (40 mg total) into the skin daily.  . hydrochlorothiazide (HYDRODIURIL) 50 MG tablet Take 50 mg by mouth daily  . lidocaine-prilocaine (EMLA) cream Apply to affected area once  . nystatin-triamcinolone ointment (MYCOLOG) Apply 1 application topically 2 (two) times daily.  . ondansetron (ZOFRAN) 8 MG tablet Take 1 tablet (8 mg total) by mouth every 8 (eight) hours as needed for refractory nausea / vomiting. Start on day 3 after chemo.  Marland Kitchen Potassium 99 MG TABS Take 99 mg by mouth daily.  Marland Kitchen PROBIOTIC PRODUCT PO Take 1 capsule by mouth daily.   . prochlorperazine (COMPAZINE) 10 MG tablet Take 1 tablet (10 mg total) by mouth every 6 (six) hours as needed (Nausea or vomiting).  . vitamin E 1000 UNIT capsule Take 1,000 Units by mouth daily.  Marland Kitchen zinc gluconate 50 MG tablet Take 50 mg by mouth daily.  . [DISCONTINUED] dicyclomine (BENTYL) 20 MG tablet Take 20 mg by mouth every 6 (six) hours.   . [DISCONTINUED] Multiple Vitamins-Minerals (ALIVE WOMENS 50+ PO) Take 1 tablet by mouth daily.  . [DISCONTINUED] naproxen sodium (ALEVE) 220 MG tablet Take 220 mg by mouth.  . [DISCONTINUED] oxyCODONE-acetaminophen (PERCOCET/ROXICET) 5-325 MG tablet Take 1 tablet by mouth every 4 (four) hours as needed for moderate pain. (Patient not taking: Reported on 10/29/2017)  . [EXPIRED] iopamidol (ISOVUE-300) 61 % injection 100 mL   . [EXPIRED] iopamidol (ISOVUE-300) 61 % injection    No facility-administered  encounter medications on file as of 11/07/2017.     Allergy: No Known Allergies  Social Hx:   Social History   Socioeconomic History  . Marital status: Single    Spouse name: Not on file  . Number of children: Not on file  . Years of education: Not on file  . Highest education level: Not on file  Occupational History  . Not on file  Social Needs  . Financial resource strain: Not on file  . Food insecurity:    Worry: Not on file    Inability: Not on file  . Transportation needs:    Medical: Not on file    Non-medical: Not on file  Tobacco Use  . Smoking status: Never Smoker  . Smokeless tobacco: Never Used  Substance and Sexual Activity  . Alcohol use: No  . Drug use: No  . Sexual activity: Not Currently    Partners: Male    Birth control/protection: None  Lifestyle  . Physical activity:    Days per week: Not on file    Minutes per session: Not on file  . Stress: Not on file  Relationships  . Social connections:    Talks on phone: Not on file    Gets together: Not on file  Attends religious service: Not on file    Active member of club or organization: Not on file    Attends meetings of clubs or organizations: Not on file    Relationship status: Not on file  . Intimate partner violence:    Fear of current or ex partner: Not on file    Emotionally abused: Not on file    Physically abused: Not on file    Forced sexual activity: Not on file  Other Topics Concern  . Not on file  Social History Narrative  . Not on file    Past Surgical Hx:  Past Surgical History:  Procedure Laterality Date  . LAPAROTOMY N/A 10/15/2017   Procedure: MINI LAPAROTOMY;  Surgeon: Everitt Amber, MD;  Location: [facility];  Service: Gynecology;  Laterality: N/A;  . MYOMECTOMY    . ROBOTIC ASSISTED TOTAL HYSTERECTOMY WITH BILATERAL SALPINGO OOPHERECTOMY Bilateral 10/15/2017   Procedure: XI ROBOTIC ASSISTED TOTAL HYSTERECTOMY GREATER THAN 250 GRAMS WITH BILATERAL SALPINGO OOPHORECTOMY,  SENTINAL LYMPH NODE BIOPSY , PERI AND PARA- [place] LYMPH NODE DISECTIOIN;  Surgeon: Everitt Amber, MD;  Location: [facility];  Service: Gynecology;  Laterality: Bilateral;    Past Medical Hx:  Past Medical History:  Diagnosis Date  . Asthma   . Diabetes mellitus without complication ([place])   . Fibroids   . Hypertension     Past Gynecological History:  No history of abnormal paps (last normal pap was 2017). G0, history of fibroids. No LMP recorded. Patient has had a hysterectomy.  Family Hx:  Family History  Problem Relation Age of Onset  . Alzheimer's disease Mother   . Liver cancer Father   . Cancer Maternal Aunt     Review of Systems:  Constitutional  Feels well,    ENT Normal appearing ears and nares bilaterally Skin/Breast  No rash, sores, jaundice, itching, dryness Cardiovascular  No chest pain, shortness of breath, or edema  Pulmonary  No cough or wheeze.  Gastro Intestinal  No nausea, vomitting, or diarrhoea. No bright red blood per rectum, no abdominal pain, change in bowel movement, or constipation.  Genito Urinary  No frequency, urgency, dysuria, no bleeding Musculo Skeletal  No myalgia, arthralgia, joint swelling or pain  Neurologic  No weakness, numbness, change in gait,  Psychology  No depression, anxiety, insomnia.   Vitals:  Blood pressure (!) 136/94, pulse 95, temperature 98.1 F (36.7 C), temperature source Oral, resp. rate 20, height 5\' 5"  (1.651 m), weight 258 lb 12.8 oz (117.4 kg), SpO2 98 %.  Physical Exam: WD in NAD Neck  Supple NROM, without any enlargements.  Lymph Node Survey No cervical supraclavicular or inguinal adenopathy Cardiovascular  Pulse normal rate, regularity and rhythm. S1 and S2 normal.  Lungs  Clear to auscultation bilateraly, without wheezes/crackles/rhonchi. Good air movement.  Skin  No rash/lesions/breakdown  Psychiatry  Alert and oriented to person, place, and time  Abdomen  Normoactive bowel sounds, abdomen soft,  non-tender and obese without evidence of hernia.  Incisions well healed. Back No CVA tenderness Genito Urinary  Vaginal cuff in tact, suture material still present at introitus. Rectal: deferred  Extremities  No bilateral cyanosis, clubbing or edema.   30 minutes of direct face to face counseling time was spent with the patient. This included discussion about prognosis, therapy recommendations and postoperative side effects and are beyond the scope of routine postoperative care.   Thereasa Solo, MD  11/07/2017, 1:13 PM

## 2017-11-08 ENCOUNTER — Ambulatory Visit (HOSPITAL_COMMUNITY)
Admission: RE | Admit: 2017-11-08 | Discharge: 2017-11-08 | Disposition: A | Discharge status: Home / Self Care | Payer: BLUE CROSS/BLUE SHIELD | Source: Ambulatory Visit | Attending: Hematology and Oncology | Admitting: Hematology and Oncology

## 2017-11-08 ENCOUNTER — Encounter (HOSPITAL_COMMUNITY): Payer: Self-pay

## 2017-11-08 ENCOUNTER — Telehealth: Payer: Self-pay

## 2017-11-08 ENCOUNTER — Other Ambulatory Visit: Payer: Self-pay | Admitting: Hematology and Oncology

## 2017-11-08 ENCOUNTER — Other Ambulatory Visit: Payer: Self-pay

## 2017-11-08 DIAGNOSIS — Z800 Family history of malignant neoplasm of digestive organs: Secondary | ICD-10-CM | POA: Insufficient documentation

## 2017-11-08 DIAGNOSIS — Z79899 Other long term (current) drug therapy: Secondary | ICD-10-CM | POA: Insufficient documentation

## 2017-11-08 DIAGNOSIS — Z809 Family history of malignant neoplasm, unspecified: Secondary | ICD-10-CM | POA: Insufficient documentation

## 2017-11-08 DIAGNOSIS — Z90710 Acquired absence of both cervix and uterus: Secondary | ICD-10-CM | POA: Diagnosis not present

## 2017-11-08 DIAGNOSIS — C541 Malignant neoplasm of endometrium: Secondary | ICD-10-CM | POA: Insufficient documentation

## 2017-11-08 DIAGNOSIS — J45909 Unspecified asthma, uncomplicated: Secondary | ICD-10-CM | POA: Insufficient documentation

## 2017-11-08 DIAGNOSIS — I10 Essential (primary) hypertension: Secondary | ICD-10-CM | POA: Diagnosis not present

## 2017-11-08 DIAGNOSIS — Z7982 Long term (current) use of aspirin: Secondary | ICD-10-CM | POA: Diagnosis not present

## 2017-11-08 DIAGNOSIS — Z90722 Acquired absence of ovaries, bilateral: Secondary | ICD-10-CM | POA: Insufficient documentation

## 2017-11-08 DIAGNOSIS — E119 Type 2 diabetes mellitus without complications: Secondary | ICD-10-CM | POA: Insufficient documentation

## 2017-11-08 DIAGNOSIS — Z9889 Other specified postprocedural states: Secondary | ICD-10-CM | POA: Diagnosis not present

## 2017-11-08 HISTORY — PX: IR IMAGING GUIDED PORT INSERTION: IMG5740

## 2017-11-08 LAB — PROTIME-INR
INR: 0.97
Prothrombin Time: 12.8 seconds (ref 11.4–15.2)

## 2017-11-08 MED ORDER — LIDOCAINE HCL 1 % IJ SOLN
INTRAMUSCULAR | Status: AC | PRN
  Administered 2017-11-08: 10 mL

## 2017-11-08 MED ORDER — HEPARIN SOD (PORK) LOCK FLUSH 100 UNIT/ML IV SOLN
INTRAVENOUS | Status: AC | PRN
  Administered 2017-11-08: 500 [IU] via INTRAVENOUS

## 2017-11-08 MED ORDER — POTASSIUM CHLORIDE 10 MEQ/100ML IV SOLN
10.0000 meq | Freq: Once | INTRAVENOUS | Status: AC
  Administered 2017-11-08: 10 meq via INTRAVENOUS
  Filled 2017-11-08 (×2): qty 100

## 2017-11-08 MED ORDER — MIDAZOLAM HCL 2 MG/2ML IJ SOLN
INTRAMUSCULAR | Status: AC | PRN
  Administered 2017-11-08: 1 mg via INTRAVENOUS

## 2017-11-08 MED ORDER — HEPARIN SOD (PORK) LOCK FLUSH 100 UNIT/ML IV SOLN
INTRAVENOUS | Status: AC
  Filled 2017-11-08: qty 5

## 2017-11-08 MED ORDER — CEFAZOLIN SODIUM-DEXTROSE 2-4 GM/100ML-% IV SOLN
2.0000 g | INTRAVENOUS | Status: AC
  Administered 2017-11-08: 2 g via INTRAVENOUS

## 2017-11-08 MED ORDER — LIDOCAINE HCL 1 % IJ SOLN
INTRAMUSCULAR | Status: AC
  Filled 2017-11-08: qty 20

## 2017-11-08 MED ORDER — FENTANYL CITRATE (PF) 100 MCG/2ML IJ SOLN
INTRAMUSCULAR | Status: AC | PRN
  Administered 2017-11-08: 50 ug via INTRAVENOUS

## 2017-11-08 MED ORDER — FENTANYL CITRATE (PF) 100 MCG/2ML IJ SOLN
INTRAMUSCULAR | Status: AC
  Filled 2017-11-08: qty 2

## 2017-11-08 MED ORDER — MIDAZOLAM HCL 2 MG/2ML IJ SOLN
INTRAMUSCULAR | Status: AC
  Filled 2017-11-08: qty 2

## 2017-11-08 MED ORDER — SODIUM CHLORIDE 0.9 % IV SOLN: INTRAVENOUS | Status: DC

## 2017-11-08 MED ORDER — CEFAZOLIN SODIUM-DEXTROSE 2-4 GM/100ML-% IV SOLN
INTRAVENOUS | Status: AC
  Filled 2017-11-08: qty 100

## 2017-11-08 NOTE — Progress Notes (Signed)
Morgan Franklin, PA called about lab orders. Pt has CBC and CMP yesterday, no need to repeat.

## 2017-11-08 NOTE — H&P (Signed)
Chief Complaint: Patient was seen in consultation today for [facility] a cath placement at the request of [place]  Referring Physician(s): Heath Lark  Supervising Physician: Corrie Mckusick  Patient Status: [facility] - Out-pt  History of Present Illness: Morgan Vincent is a 62 y.o. female   Dx endometrial cancer Hysterectomy 10/15/2017-- Dr Denman George (Lovenox daily-- LD 7/17  8am Now scheduled for [facility] placement To start chemotherapy Mon-- with Dr Natale Lay    Past Medical History:  Diagnosis Date  . Asthma   . Diabetes mellitus without complication ([place])   . Fibroids   . Hypertension     Past Surgical History:  Procedure Laterality Date  . LAPAROTOMY N/A 10/15/2017   Procedure: MINI LAPAROTOMY;  Surgeon: Everitt Amber, MD;  Location: [facility];  Service: Gynecology;  Laterality: N/A;  . MYOMECTOMY    . ROBOTIC ASSISTED TOTAL HYSTERECTOMY WITH BILATERAL SALPINGO OOPHERECTOMY Bilateral 10/15/2017   Procedure: XI ROBOTIC ASSISTED TOTAL HYSTERECTOMY GREATER THAN 250 GRAMS WITH BILATERAL SALPINGO OOPHORECTOMY, SENTINAL LYMPH NODE BIOPSY , PERI AND PARA- [place] LYMPH NODE DISECTIOIN;  Surgeon: Everitt Amber, MD;  Location: [facility];  Service: Gynecology;  Laterality: Bilateral;    Allergies: Patient has no known allergies.  Medications: Prior to Admission medications   Medication Sig Start Date End Date Taking? Authorizing Provider  acetaminophen (TYLENOL) 325 MG tablet Take 650 mg by mouth every 6 (six) hours as needed for mild pain.   Yes [provider]  Ascorbic Acid (VITAMIN C) 1000 MG tablet Take 1,000 mg by mouth daily.   Yes [provider]  aspirin 81 MG tablet Take 81 mg by mouth daily.   Yes [provider]  CALCIUM PO Take 1 tablet by mouth daily.   Yes [provider]  enoxaparin (LOVENOX) 40 MG/0.4ML injection Inject 0.4 mLs (40 mg total) into the skin daily. 10/17/17  Yes Precious Haws B, MD  hydrochlorothiazide (HYDRODIURIL) 50 MG tablet  Take 50 mg by mouth daily 03/14/17  Yes [provider]  nystatin-triamcinolone ointment (MYCOLOG) Apply 1 application topically 2 (two) times daily. 10/29/17  Yes Gorsuch, Ni, MD  Potassium 99 MG TABS Take 99 mg by mouth daily.   Yes [provider]  PROBIOTIC PRODUCT PO Take 1 capsule by mouth daily.    Yes [provider]  vitamin E 1000 UNIT capsule Take 1,000 Units by mouth daily.   Yes [provider]  zinc gluconate 50 MG tablet Take 50 mg by mouth daily.   Yes [provider]  albuterol (PROVENTIL HFA;VENTOLIN HFA) 108 (90 Base) MCG/ACT inhaler Inhale 2 puffs into the lungs every 6 (six) hours as needed for wheezing or shortness of breath.    [provider]  dexamethasone (DECADRON) 4 MG tablet Take 3 tabs at the night before and 3 tabs the morning of chemotherapy, every 3 weeks, by mouth 11/07/17   Heath Lark, MD  lidocaine-prilocaine (EMLA) cream Apply to affected area once 11/07/17   Heath Lark, MD  ondansetron (ZOFRAN) 8 MG tablet Take 1 tablet (8 mg total) by mouth every 8 (eight) hours as needed for refractory nausea / vomiting. Start on day 3 after chemo. 11/07/17   Heath Lark, MD  prochlorperazine (COMPAZINE) 10 MG tablet Take 1 tablet (10 mg total) by mouth every 6 (six) hours as needed (Nausea or vomiting). 11/07/17   Heath Lark, MD     Family History  Problem Relation Age of Onset  . Alzheimer's disease Mother   . Liver  cancer Father   . Cancer Maternal Aunt     Social History   Socioeconomic History  . Marital status: Single    Spouse name: Not on file  . Number of children: Not on file  . Years of education: Not on file  . Highest education level: Not on file  Occupational History  . Not on file  Social Needs  . Financial resource strain: Not on file  . Food insecurity:    Worry: Not on file    Inability: Not on file  . Transportation needs:    Medical: Not on file    Non-medical: Not on file  Tobacco Use    . Smoking status: Never Smoker  . Smokeless tobacco: Never Used  Substance and Sexual Activity  . Alcohol use: No  . Drug use: No  . Sexual activity: Not Currently    Partners: Male    Birth control/protection: None  Lifestyle  . Physical activity:    Days per week: Not on file    Minutes per session: Not on file  . Stress: Not on file  Relationships  . Social connections:    Talks on phone: Not on file    Gets together: Not on file    Attends religious service: Not on file    Active member of club or organization: Not on file    Attends meetings of clubs or organizations: Not on file    Relationship status: Not on file  Other Topics Concern  . Not on file  Social History Narrative  . Not on file    Review of Systems: A 12 point ROS discussed and pertinent positives are indicated in the HPI above.  All other systems are negative.  Review of Systems  Constitutional: Negative for activity change and fatigue.  Respiratory: Negative for shortness of breath.   Cardiovascular: Negative for chest pain.  Gastrointestinal: Negative for abdominal pain.  Neurological: Negative for weakness.  Psychiatric/Behavioral: Negative for behavioral problems and confusion.    Vital Signs: BP 137/71 (BP Location: Right Arm)   Pulse 89   Temp 97.9 F (36.6 C) (Oral)   Ht 5\' 5"  (1.651 m)   Wt 258 lb (117 kg)   SpO2 100%   BMI 42.93 kg/m   Physical Exam  Constitutional: She is oriented to person, place, and time.  Cardiovascular: Normal rate, regular rhythm and normal heart sounds.  Pulmonary/Chest: Effort normal and breath sounds normal.  Abdominal: Soft. Bowel sounds are normal.  Musculoskeletal: Normal range of motion.  Neurological: She is alert and oriented to person, place, and time.  Skin: Skin is warm and dry.  Psychiatric: She has a normal mood and affect. Her behavior is normal. Judgment and thought content normal.  Nursing note and vitals reviewed.   Imaging: Ct Chest  W Contrast  Result Date: 11/06/2017 CLINICAL DATA:  Newly diagnosed endometrial carcinoma. Staging. One month status post TAH-BSO. EXAM: CT CHEST, ABDOMEN, AND PELVIS WITH CONTRAST TECHNIQUE: Multidetector CT imaging of the chest, abdomen and pelvis was performed following the standard protocol during bolus administration of intravenous contrast. CONTRAST:  173mL ISOVUE-300 IOPAMIDOL (ISOVUE-300) INJECTION 61% COMPARISON:  AP CT on 09/19/2017 FINDINGS: CT CHEST FINDINGS Cardiovascular: No acute findings. Mediastinum/Lymph Nodes: No masses or pathologically enlarged lymph nodes identified. Lungs/Pleura: No pulmonary infiltrate or mass identified. No effusion present. Musculoskeletal:  No suspicious bone lesions identified. CT ABDOMEN AND PELVIS FINDINGS Hepatobiliary: No masses identified. Gallbladder is unremarkable. Pancreas:  No mass or inflammatory changes. Spleen:  Within normal limits in size and appearance. Adrenals/Urinary tract:  No masses or hydronephrosis. Stomach/Bowel: No evidence of obstruction, inflammatory process, or abnormal fluid collections. Vascular/Lymphatic: No pathologically enlarged lymph nodes identified. No abdominal aortic aneurysm. Reproductive: Expected postop changes seen from recent hysterectomy. No pelvic mass, inflammatory process, or abnormal fluid collections identified. Other:  None. Musculoskeletal:  No suspicious bone lesions identified. IMPRESSION: Expected postop changes from TAH-BSO. No complication identified. No evidence of residual or metastatic carcinoma. Electronically Signed   By: Earle Gell M.D.   On: 11/06/2017 16:07   Ct Abdomen Pelvis W Contrast  Result Date: 11/06/2017 CLINICAL DATA:  Newly diagnosed endometrial carcinoma. Staging. One month status post TAH-BSO. EXAM: CT CHEST, ABDOMEN, AND PELVIS WITH CONTRAST TECHNIQUE: Multidetector CT imaging of the chest, abdomen and pelvis was performed following the standard protocol during bolus administration of  intravenous contrast. CONTRAST:  165mL ISOVUE-300 IOPAMIDOL (ISOVUE-300) INJECTION 61% COMPARISON:  AP CT on 09/19/2017 FINDINGS: CT CHEST FINDINGS Cardiovascular: No acute findings. Mediastinum/Lymph Nodes: No masses or pathologically enlarged lymph nodes identified. Lungs/Pleura: No pulmonary infiltrate or mass identified. No effusion present. Musculoskeletal:  No suspicious bone lesions identified. CT ABDOMEN AND PELVIS FINDINGS Hepatobiliary: No masses identified. Gallbladder is unremarkable. Pancreas:  No mass or inflammatory changes. Spleen:  Within normal limits in size and appearance. Adrenals/Urinary tract:  No masses or hydronephrosis. Stomach/Bowel: No evidence of obstruction, inflammatory process, or abnormal fluid collections. Vascular/Lymphatic: No pathologically enlarged lymph nodes identified. No abdominal aortic aneurysm. Reproductive: Expected postop changes seen from recent hysterectomy. No pelvic mass, inflammatory process, or abnormal fluid collections identified. Other:  None. Musculoskeletal:  No suspicious bone lesions identified. IMPRESSION: Expected postop changes from TAH-BSO. No complication identified. No evidence of residual or metastatic carcinoma. Electronically Signed   By: Earle Gell M.D.   On: 11/06/2017 16:07    Labs:  CBC: Recent Labs    10/10/17 1218 10/16/17 0417 11/07/17 1316  WBC 6.4 8.6 6.7  HGB 12.0 11.0* 12.2  HCT 35.3* 32.1* 36.0  PLT 332 306 334    COAGS: Recent Labs    11/08/17 0822  INR 0.97    BMP: Recent Labs    10/10/17 1218 10/16/17 0417 11/07/17 1316  NA 141 140 140  K 3.2* 3.3* 3.1*  CL 101 102 102  CO2 28 30 29   GLUCOSE 113* 125* 119*  BUN 13 11 14   CALCIUM 9.8 8.9 10.3  CREATININE 0.76 0.79 0.81  GFRNONAA >60 >60 >60  GFRAA >60 >60 >60    LIVER FUNCTION TESTS: Recent Labs    10/10/17 1218 11/07/17 1316  BILITOT 0.7 0.3  AST 24 15  ALT 23 20  ALKPHOS 68 89  PROT 7.9 8.3*  ALBUMIN 4.2 4.1    TUMOR  MARKERS: No results for input(s): AFPTM, CEA, CA199, CHROMGRNA in the last 8760 hours.  Assessment and Plan:  Endometrial cancer To start chemo Mon Scheduled for Port a cath placement Risks and benefits of image guided port-a-catheter placement was discussed with the patient including, but not limited to bleeding, infection, pneumothorax, or fibrin sheath development and need for additional procedures.  All of the patient's questions were answered, patient is agreeable to proceed. Consent signed and in chart.   Thank you for this interesting consult.  I greatly enjoyed meeting Morgan Vincent and look forward to participating in their care.  A copy of this report was sent to the requesting provider on this date.  Electronically Signed: Lavonia Drafts, PA-C 11/08/2017, 8:59 AM  I spent a total of  30 Minutes   in face to face in clinical consultation, greater than 50% of which was counseling/coordinating care for [facility]

## 2017-11-08 NOTE — Telephone Encounter (Signed)
-----   Message from Heath Lark, MD sent at 11/08/2017  9:03 AM EDT ----- Regarding: low potassium Her labs showed low potassium Recommend potassium rich diet

## 2017-11-08 NOTE — Discharge Instructions (Signed)
**Note Morgan Vincent-identified via Obfuscation** Implanted Port Insertion, Care After °This sheet gives you information about how to care for yourself after your procedure. Your health care provider may also give you more specific instructions. If you have problems or questions, contact your health care provider. °What can I expect after the procedure? °After your procedure, it is common to have: °· Discomfort at the port insertion site. °· Bruising on the skin over the port. This should improve over 3-4 days. ° °Follow these instructions at home: °Port care °· After your port is placed, you will get a manufacturer's information card. The card has information about your port. Keep this card with you at all times. °· Take care of the port as told by your health care provider. Ask your health care provider if you or a family member can get training for taking care of the port at home. A home health care nurse may also take care of the port. °· Make sure to remember what type of port you have. °Incision care °· Follow instructions from your health care provider about how to take care of your port insertion site. Make sure you: °? Wash your hands with soap and water before you change your bandage (dressing). If soap and water are not available, use hand sanitizer. °? Change your dressing as told by your health care provider. °? Leave stitches (sutures), skin glue, or adhesive strips in place. These skin closures may need to stay in place for 2 weeks or longer. If adhesive strip edges start to loosen and curl up, you may trim the loose edges. Do not remove adhesive strips completely unless your health care provider tells you to do that. °· Check your port insertion site every day for signs of infection. Check for: °? More redness, swelling, or pain. °? More fluid or blood. °? Warmth. °? Pus or a bad smell. °General instructions °· Do not take baths, swim, or use a hot tub until your health care provider approves. °· Do not lift anything that is heavier than 10 lb (4.5  kg) for a week, or as told by your health care provider. °· Ask your health care provider when it is okay to: °? Return to work or school. °? Resume usual physical activities or sports. °· Do not drive for 24 hours if you were given a medicine to help you relax (sedative). °· Take over-the-counter and prescription medicines only as told by your health care provider. °· Wear a medical alert bracelet in case of an emergency. This will tell any health care providers that you have a port. °· Keep all follow-up visits as told by your health care provider. This is important. °Contact a health care provider if: °· You cannot flush your port with saline as directed, or you cannot draw blood from the port. °· You have a fever or chills. °· You have more redness, swelling, or pain around your port insertion site. °· You have more fluid or blood coming from your port insertion site. °· Your port insertion site feels warm to the touch. °· You have pus or a bad smell coming from the port insertion site. °Get help right away if: °· You have chest pain or shortness of breath. °· You have bleeding from your port that you cannot control. °Summary °· Take care of the port as told by your health care provider. °· Change your dressing as told by your health care provider. °· Keep all follow-up visits as told by your health care provider. ° **Note Morgan Vincent-identified via Obfuscation** This information is not intended to replace advice given to you by your health care provider. Make sure you discuss any questions you have with your health care provider. °Document Released: 01/29/2013 Document Revised: 03/01/2016 Document Reviewed: 03/01/2016 °[employer] Interactive Patient Education © 2017 [employer] ° °

## 2017-11-08 NOTE — Procedures (Signed)
Interventional Radiology Procedure Note  Procedure: Placement of a right IJ approach single lumen PowerPort.  Tip is positioned at the superior cavoatrial junction and catheter is ready for immediate use.  Complications: None Recommendations:  - Ok to shower tomorrow - Do not submerge for 7 days - Routine line care   Signed,  Tregan Read S. Bevin Mayall, DO   

## 2017-11-08 NOTE — Telephone Encounter (Signed)
Called and left below message. Gave list of high potassium foods. Instructed to call office for questions.

## 2017-11-12 ENCOUNTER — Inpatient Hospital Stay: Payer: BLUE CROSS/BLUE SHIELD

## 2017-11-12 VITALS — BP 133/61 | HR 73 | Temp 98.2°F | Resp 16

## 2017-11-12 DIAGNOSIS — C541 Malignant neoplasm of endometrium: Secondary | ICD-10-CM

## 2017-11-12 MED ORDER — HEPARIN SOD (PORK) LOCK FLUSH 100 UNIT/ML IV SOLN
500.0000 [IU] | Freq: Once | INTRAVENOUS | Status: AC | PRN
  Administered 2017-11-12: 500 [IU]
  Filled 2017-11-12: qty 5

## 2017-11-12 MED ORDER — SODIUM CHLORIDE 0.9 % IV SOLN
140.0000 mg/m2 | Freq: Once | INTRAVENOUS | Status: AC
  Administered 2017-11-12: 324 mg via INTRAVENOUS
  Filled 2017-11-12: qty 54

## 2017-11-12 MED ORDER — FAMOTIDINE IN NACL 20-0.9 MG/50ML-% IV SOLN
INTRAVENOUS | Status: AC
  Filled 2017-11-12: qty 50

## 2017-11-12 MED ORDER — SODIUM CHLORIDE 0.9% FLUSH
10.0000 mL | INTRAVENOUS | Status: DC | PRN
  Administered 2017-11-12: 10 mL
  Filled 2017-11-12: qty 10

## 2017-11-12 MED ORDER — SODIUM CHLORIDE 0.9 % IV SOLN
Freq: Once | INTRAVENOUS | Status: AC
  Administered 2017-11-12: 09:00:00 via INTRAVENOUS

## 2017-11-12 MED ORDER — SODIUM CHLORIDE 0.9 % IV SOLN
Freq: Once | INTRAVENOUS | Status: AC
  Administered 2017-11-12: 09:00:00 via INTRAVENOUS
  Filled 2017-11-12: qty 5

## 2017-11-12 MED ORDER — DIPHENHYDRAMINE HCL 50 MG/ML IJ SOLN
50.0000 mg | Freq: Once | INTRAMUSCULAR | Status: AC
  Administered 2017-11-12: 50 mg via INTRAVENOUS

## 2017-11-12 MED ORDER — PALONOSETRON HCL INJECTION 0.25 MG/5ML
0.2500 mg | Freq: Once | INTRAVENOUS | Status: AC
  Administered 2017-11-12: 0.25 mg via INTRAVENOUS

## 2017-11-12 MED ORDER — PALONOSETRON HCL INJECTION 0.25 MG/5ML
INTRAVENOUS | Status: AC
Start: 2017-11-12 — End: ?
  Filled 2017-11-12: qty 5

## 2017-11-12 MED ORDER — SODIUM CHLORIDE 0.9 % IV SOLN
750.0000 mg | Freq: Once | INTRAVENOUS | Status: AC
  Administered 2017-11-12: 750 mg via INTRAVENOUS
  Filled 2017-11-12: qty 75

## 2017-11-12 MED ORDER — FAMOTIDINE IN NACL 20-0.9 MG/50ML-% IV SOLN
20.0000 mg | Freq: Once | INTRAVENOUS | Status: AC
  Administered 2017-11-12: 20 mg via INTRAVENOUS

## 2017-11-12 MED ORDER — DIPHENHYDRAMINE HCL 50 MG/ML IJ SOLN
INTRAMUSCULAR | Status: AC
  Filled 2017-11-12: qty 1

## 2017-11-12 NOTE — Patient Instructions (Signed)
[facility] Discharge Instructions for Patients Receiving Chemotherapy  Today you received the following chemotherapy agents: Taxol, Carboplatin.  To help prevent nausea and vomiting after your treatment, we encourage you to take your nausea medication as prescribed.   If you develop nausea and vomiting that is not controlled by your nausea medication, call the clinic.   BELOW ARE SYMPTOMS THAT SHOULD BE REPORTED IMMEDIATELY:  *FEVER GREATER THAN 100.5 F  *CHILLS WITH OR WITHOUT FEVER  NAUSEA AND VOMITING THAT IS NOT CONTROLLED WITH YOUR NAUSEA MEDICATION  *UNUSUAL SHORTNESS OF BREATH  *UNUSUAL BRUISING OR BLEEDING  TENDERNESS IN MOUTH AND THROAT WITH OR WITHOUT PRESENCE OF ULCERS  *URINARY PROBLEMS  *BOWEL PROBLEMS  UNUSUAL RASH Items with * indicate a potential emergency and should be followed up as soon as possible.  Feel free to call the clinic should you have any questions or concerns. The clinic phone number is [phone].  Please show the [facility] at check-in to the Emergency Department and triage nurse.   Paclitaxel injection (Taxol) What is this medicine? PACLITAXEL (PAK li TAX el) is a chemotherapy drug. It targets fast dividing cells, like cancer cells, and causes these cells to die. This medicine is used to treat ovarian cancer, breast cancer, and other cancers. This medicine may be used for other purposes; ask your health care provider or pharmacist if you have questions. COMMON BRAND NAME(S): Onxol, Taxol What should I tell my health care provider before I take this medicine? They need to know if you have any of these conditions: -blood disorders -irregular heartbeat -infection (especially a virus infection such as chickenpox, cold sores, or herpes) -liver disease -previous or ongoing radiation therapy -an unusual or allergic reaction to paclitaxel, alcohol, polyoxyethylated castor oil, other chemotherapy agents, other medicines,  foods, dyes, or preservatives -pregnant or trying to get pregnant -breast-feeding How should I use this medicine? This drug is given as an infusion into a vein. It is administered in a hospital or clinic by a specially trained health care professional. Talk to your pediatrician regarding the use of this medicine in children. Special care may be needed. Overdosage: If you think you have taken too much of this medicine contact a poison control center or emergency room at once. NOTE: This medicine is only for you. Do not share this medicine with others. What if I miss a dose? It is important not to miss your dose. Call your doctor or health care professional if you are unable to keep an appointment. What may interact with this medicine? Do not take this medicine with any of the following medications: -disulfiram -metronidazole This medicine may also interact with the following medications: -cyclosporine -diazepam -ketoconazole -medicines to increase blood counts like filgrastim, pegfilgrastim, sargramostim -other chemotherapy drugs like cisplatin, doxorubicin, epirubicin, etoposide, teniposide, vincristine -quinidine -testosterone -vaccines -verapamil Talk to your doctor or health care professional before taking any of these medicines: -acetaminophen -aspirin -ibuprofen -ketoprofen -naproxen This list may not describe all possible interactions. Give your health care provider a list of all the medicines, herbs, non-prescription drugs, or dietary supplements you use. Also tell them if you smoke, drink alcohol, or use illegal drugs. Some items may interact with your medicine. What should I watch for while using this medicine? Your condition will be monitored carefully while you are receiving this medicine. You will need important blood work done while you are taking this medicine. This medicine can cause serious allergic reactions. To reduce your risk you will  need to take other  medicine(s) before treatment with this medicine. If you experience allergic reactions like skin rash, itching or hives, swelling of the face, lips, or tongue, tell your doctor or health care professional right away. In some cases, you may be given additional medicines to help with side effects. Follow all directions for their use. This drug may make you feel generally unwell. This is not uncommon, as chemotherapy can affect healthy cells as well as cancer cells. Report any side effects. Continue your course of treatment even though you feel ill unless your doctor tells you to stop. Call your doctor or health care professional for advice if you get a fever, chills or sore throat, or other symptoms of a cold or flu. Do not treat yourself. This drug decreases your body's ability to fight infections. Try to avoid being around people who are sick. This medicine may increase your risk to bruise or bleed. Call your doctor or health care professional if you notice any unusual bleeding. Be careful brushing and flossing your teeth or using a toothpick because you may get an infection or bleed more easily. If you have any dental work done, tell your dentist you are receiving this medicine. Avoid taking products that contain aspirin, acetaminophen, ibuprofen, naproxen, or ketoprofen unless instructed by your doctor. These medicines may hide a fever. Do not become pregnant while taking this medicine. Women should inform their doctor if they wish to become pregnant or think they might be pregnant. There is a potential for serious side effects to an unborn child. Talk to your health care professional or pharmacist for more information. Do not breast-feed an infant while taking this medicine. Men are advised not to father a child while receiving this medicine. This product may contain alcohol. Ask your pharmacist or healthcare provider if this medicine contains alcohol. Be sure to tell all healthcare providers you are  taking this medicine. Certain medicines, like metronidazole and disulfiram, can cause an unpleasant reaction when taken with alcohol. The reaction includes flushing, headache, nausea, vomiting, sweating, and increased thirst. The reaction can last from 30 minutes to several hours. What side effects may I notice from receiving this medicine? Side effects that you should report to your doctor or health care professional as soon as possible: -allergic reactions like skin rash, itching or hives, swelling of the face, lips, or tongue -low blood counts - This drug may decrease the number of white blood cells, red blood cells and platelets. You may be at increased risk for infections and bleeding. -signs of infection - fever or chills, cough, sore throat, pain or difficulty passing urine -signs of decreased platelets or bleeding - bruising, pinpoint red spots on the skin, black, tarry stools, nosebleeds -signs of decreased red blood cells - unusually weak or tired, fainting spells, lightheadedness -breathing problems -chest pain -high or low blood pressure -mouth sores -nausea and vomiting -pain, swelling, redness or irritation at the injection site -pain, tingling, numbness in the hands or feet -slow or irregular heartbeat -swelling of the ankle, feet, hands Side effects that usually do not require medical attention (report to your doctor or health care professional if they continue or are bothersome): -bone pain -complete hair loss including hair on your head, underarms, pubic hair, eyebrows, and eyelashes -changes in the color of fingernails -diarrhea -loosening of the fingernails -loss of appetite -muscle or joint pain -red flush to skin -sweating This list may not describe all possible side effects. Call your doctor for medical  advice about side effects. You may report side effects to FDA at 1-800-FDA-1088. Where should I keep my medicine? This drug is given in a hospital or clinic and  will not be stored at home. NOTE: This sheet is a summary. It may not cover all possible information. If you have questions about this medicine, talk to your doctor, pharmacist, or health care provider.  2018 Elsevier/Gold Standard (2015-02-09 19:58:00)   Carboplatin injection What is this medicine? CARBOPLATIN (KAR boe pla tin) is a chemotherapy drug. It targets fast dividing cells, like cancer cells, and causes these cells to die. This medicine is used to treat ovarian cancer and many other cancers. This medicine may be used for other purposes; ask your health care provider or pharmacist if you have questions. COMMON BRAND NAME(S): Paraplatin What should I tell my health care provider before I take this medicine? They need to know if you have any of these conditions: -blood disorders -hearing problems -kidney disease -recent or ongoing radiation therapy -an unusual or allergic reaction to carboplatin, cisplatin, other chemotherapy, other medicines, foods, dyes, or preservatives -pregnant or trying to get pregnant -breast-feeding How should I use this medicine? This drug is usually given as an infusion into a vein. It is administered in a hospital or clinic by a specially trained health care professional. Talk to your pediatrician regarding the use of this medicine in children. Special care may be needed. Overdosage: If you think you have taken too much of this medicine contact a poison control center or emergency room at once. NOTE: This medicine is only for you. Do not share this medicine with others. What if I miss a dose? It is important not to miss a dose. Call your doctor or health care professional if you are unable to keep an appointment. What may interact with this medicine? -medicines for seizures -medicines to increase blood counts like filgrastim, pegfilgrastim, sargramostim -some antibiotics like amikacin, gentamicin, neomycin, streptomycin, tobramycin -vaccines Talk to  your doctor or health care professional before taking any of these medicines: -acetaminophen -aspirin -ibuprofen -ketoprofen -naproxen This list may not describe all possible interactions. Give your health care provider a list of all the medicines, herbs, non-prescription drugs, or dietary supplements you use. Also tell them if you smoke, drink alcohol, or use illegal drugs. Some items may interact with your medicine. What should I watch for while using this medicine? Your condition will be monitored carefully while you are receiving this medicine. You will need important blood work done while you are taking this medicine. This drug may make you feel generally unwell. This is not uncommon, as chemotherapy can affect healthy cells as well as cancer cells. Report any side effects. Continue your course of treatment even though you feel ill unless your doctor tells you to stop. In some cases, you may be given additional medicines to help with side effects. Follow all directions for their use. Call your doctor or health care professional for advice if you get a fever, chills or sore throat, or other symptoms of a cold or flu. Do not treat yourself. This drug decreases your body's ability to fight infections. Try to avoid being around people who are sick. This medicine may increase your risk to bruise or bleed. Call your doctor or health care professional if you notice any unusual bleeding. Be careful brushing and flossing your teeth or using a toothpick because you may get an infection or bleed more easily. If you have any dental work done, tell  your dentist you are receiving this medicine. Avoid taking products that contain aspirin, acetaminophen, ibuprofen, naproxen, or ketoprofen unless instructed by your doctor. These medicines may hide a fever. Do not become pregnant while taking this medicine. Women should inform their doctor if they wish to become pregnant or think they might be pregnant. There is a  potential for serious side effects to an unborn child. Talk to your health care professional or pharmacist for more information. Do not breast-feed an infant while taking this medicine. What side effects may I notice from receiving this medicine? Side effects that you should report to your doctor or health care professional as soon as possible: -allergic reactions like skin rash, itching or hives, swelling of the face, lips, or tongue -signs of infection - fever or chills, cough, sore throat, pain or difficulty passing urine -signs of decreased platelets or bleeding - bruising, pinpoint red spots on the skin, black, tarry stools, nosebleeds -signs of decreased red blood cells - unusually weak or tired, fainting spells, lightheadedness -breathing problems -changes in hearing -changes in vision -chest pain -high blood pressure -low blood counts - This drug may decrease the number of white blood cells, red blood cells and platelets. You may be at increased risk for infections and bleeding. -nausea and vomiting -pain, swelling, redness or irritation at the injection site -pain, tingling, numbness in the hands or feet -problems with balance, talking, walking -trouble passing urine or change in the amount of urine Side effects that usually do not require medical attention (report to your doctor or health care professional if they continue or are bothersome): -hair loss -loss of appetite -metallic taste in the mouth or changes in taste This list may not describe all possible side effects. Call your doctor for medical advice about side effects. You may report side effects to FDA at 1-800-FDA-1088. Where should I keep my medicine? This drug is given in a hospital or clinic and will not be stored at home. NOTE: This sheet is a summary. It may not cover all possible information. If you have questions about this medicine, talk to your doctor, pharmacist, or health care provider.  2018 Elsevier/Gold  Standard (2007-07-16 14:38:05)    Implanted [facility] Guide An implanted port is a type of central line that is placed under the skin. Central lines are used to provide IV access when treatment or nutrition needs to be given through a person's veins. Implanted ports are used for long-term IV access. An implanted port may be placed because:  You need IV medicine that would be irritating to the small veins in your hands or arms.  You need long-term IV medicines, such as antibiotics.  You need IV nutrition for a long period.  You need frequent blood draws for lab tests.  You need dialysis.  Implanted ports are usually placed in the chest area, but they can also be placed in the upper arm, the abdomen, or the leg. An implanted port has two main parts:  Reservoir. The reservoir is round and will appear as a small, raised area under your skin. The reservoir is the part where a needle is inserted to give medicines or draw blood.  Catheter. The catheter is a thin, flexible tube that extends from the reservoir. The catheter is placed into a large vein. Medicine that is inserted into the reservoir goes into the catheter and then into the vein.  How will I care for my incision site? Do not get the incision site wet.  Bathe or shower as directed by your health care provider. How is my port accessed? Special steps must be taken to access the port:  Before the port is accessed, a numbing cream can be placed on the skin. This helps numb the skin over the port site.  Your health care provider uses a sterile technique to access the port. ? Your health care provider must put on a mask and sterile gloves. ? The skin over your port is cleaned carefully with an antiseptic and allowed to dry. ? The port is gently pinched between sterile gloves, and a needle is inserted into the port.  Only "non-coring" port needles should be used to access the port. Once the port is accessed, a blood return should be  checked. This helps ensure that the port is in the vein and is not clogged.  If your port needs to remain accessed for a constant infusion, a clear (transparent) bandage will be placed over the needle site. The bandage and needle will need to be changed every week, or as directed by your health care provider.  Keep the bandage covering the needle clean and dry. Do not get it wet. Follow your health care provider's instructions on how to take a shower or bath while the port is accessed.  If your port does not need to stay accessed, no bandage is needed over the port.  What is flushing? Flushing helps keep the port from getting clogged. Follow your health care provider's instructions on how and when to flush the port. Ports are usually flushed with saline solution or a medicine called heparin. The need for flushing will depend on how the port is used.  If the port is used for intermittent medicines or blood draws, the port will need to be flushed: ? After medicines have been given. ? After blood has been drawn. ? As part of routine maintenance.  If a constant infusion is running, the port may not need to be flushed.  How long will my port stay implanted? The port can stay in for as long as your health care provider thinks it is needed. When it is time for the port to come out, surgery will be done to remove it. The procedure is similar to the one performed when the port was put in. When should I seek immediate medical care? When you have an implanted port, you should seek immediate medical care if:  You notice a bad smell coming from the incision site.  You have swelling, redness, or drainage at the incision site.  You have more swelling or pain at the port site or the surrounding area.  You have a fever that is not controlled with medicine.  This information is not intended to replace advice given to you by your health care provider. Make sure you discuss any questions you have with  your health care provider. Document Released: 04/10/2005 Document Revised: 09/16/2015 Document Reviewed: 12/16/2012 Elsevier Interactive Patient Education  2017 [place].    Hypokalemia Hypokalemia means that the amount of potassium in the blood is lower than normal.Potassium is a chemical that helps regulate the amount of fluid in the body (electrolyte). It also stimulates muscle tightening (contraction) and helps nerves work properly.Normally, most of the body's potassium is inside of cells, and only a very small amount is in the blood. Because the amount in the blood is so small, minor changes to potassium levels in the blood can be life-threatening. What are the causes? This condition  may be caused by:  Antibiotic medicine.  Diarrhea or vomiting. Taking too much of a medicine that helps you have a bowel movement (laxative) can cause diarrhea and lead to hypokalemia.  Chronic kidney disease (CKD).  Medicines that help the body get rid of excess fluid (diuretics).  Eating disorders, such as bulimia.  Low magnesium levels in the body.  Sweating a lot.  What are the signs or symptoms? Symptoms of this condition include:  Weakness.  Constipation.  Fatigue.  Muscle cramps.  Mental confusion.  Skipped heartbeats or irregular heartbeat (palpitations).  Tingling or numbness.  How is this diagnosed? This condition is diagnosed with a blood test. How is this treated? Hypokalemia can be treated by taking potassium supplements by mouth or adjusting the medicines that you take. Treatment may also include eating more foods that contain a lot of potassium. If your potassium level is very low, you may need to get potassium through an IV tube in one of your veins and be monitored in the hospital. Follow these instructions at home:  Take over-the-counter and prescription medicines only as told by your health care provider. This includes vitamins and supplements.  Eat a  healthy diet. A healthy diet includes fresh fruits and vegetables, whole grains, healthy fats, and lean proteins.  If instructed, eat more foods that contain a lot of potassium, such as: ? Nuts, such as peanuts and pistachios. ? Seeds, such as sunflower seeds and pumpkin seeds. ? Peas, lentils, and lima beans. ? Whole grain and bran cereals and breads. ? Fresh fruits and vegetables, such as apricots, avocado, bananas, cantaloupe, kiwi, oranges, tomatoes, asparagus, and potatoes. ? Orange juice. ? Tomato juice. ? Red meats. ? Yogurt.  Keep all follow-up visits as told by your health care provider. This is important. Contact a health care provider if:  You have weakness that gets worse.  You feel your heart pounding or racing.  You vomit.  You have diarrhea.  You have diabetes (diabetes mellitus) and you have trouble keeping your blood sugar (glucose) in your target range. Get help right away if:  You have chest pain.  You have shortness of breath.  You have vomiting or diarrhea that lasts for more than 2 days.  You faint. This information is not intended to replace advice given to you by your health care provider. Make sure you discuss any questions you have with your health care provider. Document Released: 04/10/2005 Document Revised: 11/27/2015 Document Reviewed: 11/27/2015 Elsevier Interactive Patient Education  2018 [employer].

## 2017-11-19 ENCOUNTER — Telehealth: Payer: Self-pay | Admitting: *Deleted

## 2017-11-19 NOTE — Telephone Encounter (Signed)
Called patient to inform of [facility], spoke with patient and she is aware of these appts. 

## 2017-11-30 ENCOUNTER — Encounter: Payer: Self-pay | Admitting: Hematology and Oncology

## 2017-11-30 ENCOUNTER — Inpatient Hospital Stay: Payer: BLUE CROSS/BLUE SHIELD | Attending: Gynecologic Oncology

## 2017-11-30 ENCOUNTER — Inpatient Hospital Stay (HOSPITAL_BASED_OUTPATIENT_CLINIC_OR_DEPARTMENT_OTHER): Payer: BLUE CROSS/BLUE SHIELD | Admitting: Hematology and Oncology

## 2017-11-30 ENCOUNTER — Telehealth: Payer: Self-pay | Admitting: Hematology and Oncology

## 2017-11-30 DIAGNOSIS — E119 Type 2 diabetes mellitus without complications: Secondary | ICD-10-CM | POA: Diagnosis not present

## 2017-11-30 DIAGNOSIS — Z5111 Encounter for antineoplastic chemotherapy: Secondary | ICD-10-CM | POA: Diagnosis present

## 2017-11-30 DIAGNOSIS — D6481 Anemia due to antineoplastic chemotherapy: Secondary | ICD-10-CM | POA: Insufficient documentation

## 2017-11-30 DIAGNOSIS — L609 Nail disorder, unspecified: Secondary | ICD-10-CM | POA: Insufficient documentation

## 2017-11-30 DIAGNOSIS — E876 Hypokalemia: Secondary | ICD-10-CM | POA: Insufficient documentation

## 2017-11-30 DIAGNOSIS — G620 Drug-induced polyneuropathy: Secondary | ICD-10-CM | POA: Diagnosis not present

## 2017-11-30 DIAGNOSIS — C541 Malignant neoplasm of endometrium: Secondary | ICD-10-CM | POA: Diagnosis not present

## 2017-11-30 DIAGNOSIS — D61810 Antineoplastic chemotherapy induced pancytopenia: Secondary | ICD-10-CM | POA: Diagnosis not present

## 2017-11-30 DIAGNOSIS — T451X5A Adverse effect of antineoplastic and immunosuppressive drugs, initial encounter: Secondary | ICD-10-CM

## 2017-11-30 LAB — CBC WITH DIFFERENTIAL (CANCER CENTER ONLY)
BASOS ABS: 0.1 10*3/uL (ref 0.0–0.1)
Basophils Relative: 1 %
EOS ABS: 0.2 10*3/uL (ref 0.0–0.5)
EOS PCT: 4 %
HCT: 34.7 % — ABNORMAL LOW (ref 34.8–46.6)
Hemoglobin: 11.5 g/dL — ABNORMAL LOW (ref 11.6–15.9)
Lymphocytes Relative: 36 %
Lymphs Abs: 1.8 10*3/uL (ref 0.9–3.3)
MCH: 27.2 pg (ref 25.1–34.0)
MCHC: 33.1 g/dL (ref 31.5–36.0)
MCV: 82.2 fL (ref 79.5–101.0)
Monocytes Absolute: 0.6 10*3/uL (ref 0.1–0.9)
Monocytes Relative: 11 %
Neutro Abs: 2.5 10*3/uL (ref 1.5–6.5)
Neutrophils Relative %: 48 %
PLATELETS: 204 10*3/uL (ref 145–400)
RBC: 4.23 MIL/uL (ref 3.70–5.45)
RDW: 14.8 % — AB (ref 11.2–14.5)
WBC: 5.1 10*3/uL (ref 3.9–10.3)

## 2017-11-30 LAB — CMP (CANCER CENTER ONLY)
ALK PHOS: 92 U/L (ref 38–126)
ALT: 25 U/L (ref 0–44)
AST: 18 U/L (ref 15–41)
Albumin: 3.8 g/dL (ref 3.5–5.0)
Anion gap: 12 (ref 5–15)
BUN: 13 mg/dL (ref 8–23)
CALCIUM: 9.4 mg/dL (ref 8.9–10.3)
CO2: 25 mmol/L (ref 22–32)
CREATININE: 0.82 mg/dL (ref 0.44–1.00)
Chloride: 102 mmol/L (ref 98–111)
GFR, Est AFR Am: >60 mL/min (ref >60)
Glucose, Bld: 111 mg/dL — ABNORMAL HIGH (ref 70–99)
Potassium: 3.3 mmol/L — ABNORMAL LOW (ref 3.5–5.1)
Sodium: 139 mmol/L (ref 135–145)
TOTAL PROTEIN: 7.6 g/dL (ref 6.5–8.1)

## 2017-11-30 NOTE — Telephone Encounter (Signed)
Gave patient avs and calendar of upcoming appts.  °

## 2017-11-30 NOTE — Progress Notes (Signed)
[place] OFFICE PROGRESS NOTE  Patient Care Team: [name], MD as PCP - General (Geriatric Medicine) Ena Dawley, MD as Consulting Physician (Obstetrics and Gynecology)  ASSESSMENT & PLAN:  Endometrial cancer [facility]) She tolerated cycle 1 of chemotherapy well except for some mild fatigue She denies worsening peripheral neuropathy We will proceed with treatment without dose adjustment  Diabetes mellitus without complication ([place]) The patient is at risk of uncontrolled diabetes while on treatment We will monitor her blood sugar carefully during treatment  Hypokalemia, inadequate intake She is noted to have mild hypokalemia I recommend potassium rich diet rather than potassium supplement  Anemia due to antineoplastic chemotherapy This is likely due to recent treatment. The patient denies recent history of bleeding such as epistaxis, hematuria or hematochezia. She is asymptomatic from the anemia. I will observe for now.  She does not require transfusion now. I will continue the chemotherapy at current dose without dosage adjustment.  If the anemia gets progressive worse in the future, I might have to delay her treatment or adjust the chemotherapy dose.    No orders of the defined types were placed in this encounter.   INTERVAL HISTORY: Please see below for problem oriented charting. She is seen prior to cycle 2 of chemotherapy She tolerated last cycle of therapy well She complained of mild fatigue Denies peripheral neuropathy No abnormal vaginal discharge No nausea, vomiting or changes in bowel habits No recent infection, fever or chills  SUMMARY OF ONCOLOGIC HISTORY: Oncology History   MSI stable Endometrioid     Endometrial cancer ([place])   09/19/2017 Initial Diagnosis    A CT scan of the abdomen and pelvis was performed which revealed mild inflammation involving distal small bowel loops including the terminal ileum with no obstruction,  diverticulosis without definite diverticulitis.  No abscess.  The uterus was markedly abnormal with large heterogeneous soft tissues within the endometrial cavity.  There are multiple calcified fibroids identified.  She was presumed to have a diagnosis of gastroenteritis and was discharged home with recommendation to follow-up with her OB/GYN provider for work-up of her abnormal uterine findings.  She saw Dr. Raphael Gibney on May 31st, 2019 and a Pap smear was obtained which revealed atypical glandular cells, HPV negative.  An endometrial biopsy was performed which revealed minute fragments of malignant neoplasm, favor high-grade carcinoma.  There is small amount of of tissue was too small for further evaluation with immunohistochemistry     10/15/2017 Pathology Results    1. Lymph node, sentinel, biopsy, right obturator AMENDED DIAGNOSIS: - METASTATIC ADENOCARCINOMA TO LYMPH NODE (1/1). (CONFIRMED WITH IMMUNOHISTOCHEMICAL STAIN FOR PANKERATIN). 2. Lymph nodes, regional resection, left pelvic - SIX LYMPH NODES, NEGATIVE FOR CARCINOMA (0/6). 3. Lymph nodes, regional resection, left para aortic - THREE LYMPH NODES, NEGATIVE FOR CARCINOMA (0/3). 4. 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 - CARCINOMA INVADES FOR A DEPTH OF 1.0 CM WHERE MYOMETRIAL THICKNESS IS 3.0 CM (LESS THAN 50%). - TUMOR DOES NOT INVOLVE CERVIX OF SEROSAL SURFACE. - LYMPHOVASCULAR INVASION IS PRESENT. - BENIGN LEIOMYOMATA. - BENIGN BILATERAL OVARIES AND FALLOPIAN TUBES. - SEE ONCOLOGY TABLE. Microscopic Comment 4. UTERUS, CARCINOMA OR CARCINOSARCOMA Procedure: Total hysterectomy with bilateral salpingo-oophorectomy. Histologic type: Endometrioid adenocarcinoma. Histologic Grade: FIGO grade II. Myometrial invasion: Depth of invasion: 10 mm Myometrial thickness: 30 mm Uterine Serosa  Involvement: Not identified. Cervical stromal involvement: Not identified. Extent of involvement of other  organs: N/A. Lymphovascular invasion: Present. Regional Lymph Nodes: Examined: 1 Sentinel 9 Non-sentinel 10 Total Lymph nodes with metastasis: 1 Isolated tumor cells: 0 Micrometastasis: 0 Macrometastasis: 1 Extranodal extension: not present. Tumor block for ancillary studies: 13F and 4G. MMR / MSI testing: Pending. Pathologic Stage Classification (pTNM, AJCC 8th edition): pT1a, pN1a. (v4.1.0.0) Diagnosis Note 4. Immunohistochemical stains for MMR-related proteins and molecular studies for microsatellite instability are pending and will be reported in an addendum. (NDK:gt, 10/17/17)     10/15/2017 Surgery    Surgeon: Donaciano Eva    Operation: Robotic-assisted laparoscopic total hysterectomy >250gm with bilateral salpingoophorectomy, SLN biopsy, pelvic and para-aortic lymphadenectomy  Operative Findings:  : 14cm bulky fibroid uterus with adhesions to appendix. Slightly enlarged right obturator SLN. Unilateral mapping to right side.  No gross extrauterine disease.       10/29/2017 Cancer Staging    Staging form: Corpus Uteri - Carcinoma and Carcinosarcoma, AJCC 8th Edition - Pathologic: Stage IIIC1 (pT1a, pN1a, cM0) - Signed by Heath Lark, MD on 10/29/2017    11/06/2017 Imaging    Expected postop changes from TAH-BSO. No complication identified. No evidence of residual or metastatic carcinoma.    11/08/2017 Procedure    Status post right IJ port catheter placement. Catheter ready for use.    11/12/2017 -  Chemotherapy    The patient had carboplatin and taxol     REVIEW OF SYSTEMS:   Constitutional: Denies fevers, chills or abnormal weight loss Eyes: Denies blurriness of vision Ears, nose, mouth, throat, and face: Denies mucositis or sore throat Respiratory: Denies cough, dyspnea or wheezes Cardiovascular: Denies palpitation, chest discomfort or lower extremity  swelling Gastrointestinal:  Denies nausea, heartburn or change in bowel habits Skin: Denies abnormal skin rashes Lymphatics: Denies new lymphadenopathy or easy bruising Neurological:Denies numbness, tingling or new weaknesses Behavioral/Psych: Mood is stable, no new changes  All other systems were reviewed with the patient and are negative.  I have reviewed the past medical history, past surgical history, social history and family history with the patient and they are unchanged from previous note.  ALLERGIES:  has No Known Allergies.  MEDICATIONS:  Current Outpatient Medications  Medication Sig Dispense Refill  . acetaminophen (TYLENOL) 325 MG tablet Take 650 mg by mouth every 6 (six) hours as needed for mild pain.    Marland Kitchen albuterol (PROVENTIL HFA;VENTOLIN HFA) 108 (90 Base) MCG/ACT inhaler Inhale 2 puffs into the lungs every 6 (six) hours as needed for wheezing or shortness of breath.    . Ascorbic Acid (VITAMIN C) 1000 MG tablet Take 1,000 mg by mouth daily.    Marland Kitchen aspirin 81 MG tablet Take 81 mg by mouth daily.    Marland Kitchen CALCIUM PO Take 1 tablet by mouth daily.    Marland Kitchen dexamethasone (DECADRON) 4 MG tablet Take 3 tabs at the night before and 3 tabs the morning of chemotherapy, every 3 weeks, by mouth 36 tablet 0  . enoxaparin (LOVENOX) 40 MG/0.4ML injection Inject 0.4 mLs (40 mg total) into the skin daily. 10 Syringe 2  . hydrochlorothiazide (HYDRODIURIL) 50 MG tablet Take 50 mg by mouth daily    . lidocaine-prilocaine (EMLA) cream Apply to affected area once 30 g 3  . nystatin-triamcinolone ointment (MYCOLOG) Apply 1 application topically 2 (two) times daily. 30 g 0  . ondansetron (ZOFRAN) 8 MG tablet Take 1 tablet (8 mg total) by mouth every 8 (eight) hours as needed for refractory nausea / vomiting. Start on day 3 after chemo. 30 tablet 1  .  Potassium 99 MG TABS Take 99 mg by mouth daily.    Marland Kitchen PROBIOTIC PRODUCT PO Take 1 capsule by mouth daily.     . prochlorperazine (COMPAZINE) 10 MG tablet Take  1 tablet (10 mg total) by mouth every 6 (six) hours as needed (Nausea or vomiting). 30 tablet 1  . vitamin E 1000 UNIT capsule Take 1,000 Units by mouth daily.    Marland Kitchen zinc gluconate 50 MG tablet Take 50 mg by mouth daily.     No current facility-administered medications for this visit.     PHYSICAL EXAMINATION: ECOG PERFORMANCE STATUS: 1 - Symptomatic but completely ambulatory  Vitals:   11/30/17 0954  BP: (!) 136/93  Pulse: (!) 110  Resp: 18  Temp: 97.8 F (36.6 C)  SpO2: 100%   Filed Weights   11/30/17 0954  Weight: 259 lb 6.4 oz (117.7 kg)    GENERAL:alert, no distress and comfortable SKIN: skin color, texture, turgor are normal, no rashes or significant lesions EYES: normal, Conjunctiva are pink and non-injected, sclera clear OROPHARYNX:no exudate, no erythema and lips, buccal mucosa, and tongue normal  NECK: supple, thyroid normal size, non-tender, without nodularity LYMPH:  no palpable lymphadenopathy in the cervical, axillary or inguinal LUNGS: clear to auscultation and percussion with normal breathing effort HEART: regular rate & rhythm and no murmurs and no lower extremity edema ABDOMEN:abdomen soft, non-tender and normal bowel sounds Musculoskeletal:no cyanosis of digits and no clubbing  NEURO: alert & oriented x 3 with fluent speech, no focal motor/sensory deficits  LABORATORY DATA:  I have reviewed the data as listed    Component Value Date/Time   NA 139 11/30/2017 0846   K 3.3 (L) 11/30/2017 0846   CL 102 11/30/2017 0846   CO2 25 11/30/2017 0846   GLUCOSE 111 (H) 11/30/2017 0846   BUN 13 11/30/2017 0846   CREATININE 0.82 11/30/2017 0846   CALCIUM 9.4 11/30/2017 0846   PROT 7.6 11/30/2017 0846   ALBUMIN 3.8 11/30/2017 0846   AST 18 11/30/2017 0846   ALT 25 11/30/2017 0846   ALKPHOS 92 11/30/2017 0846   BILITOT <0.2 (L) 11/30/2017 0846   GFRNONAA >60 11/30/2017 0846   GFRAA >60 11/30/2017 0846    No results found for: SPEP, UPEP  Lab Results   Component Value Date   WBC 5.1 11/30/2017   NEUTROABS 2.5 11/30/2017   HGB 11.5 (L) 11/30/2017   HCT 34.7 (L) 11/30/2017   MCV 82.2 11/30/2017   PLT 204 11/30/2017      Chemistry      Component Value Date/Time   NA 139 11/30/2017 0846   K 3.3 (L) 11/30/2017 0846   CL 102 11/30/2017 0846   CO2 25 11/30/2017 0846   BUN 13 11/30/2017 0846   CREATININE 0.82 11/30/2017 0846      Component Value Date/Time   CALCIUM 9.4 11/30/2017 0846   ALKPHOS 92 11/30/2017 0846   AST 18 11/30/2017 0846   ALT 25 11/30/2017 0846   BILITOT <0.2 (L) 11/30/2017 0846       RADIOGRAPHIC STUDIES: I have personally reviewed the radiological images as listed and agreed with the findings in the report. Ct Chest W Contrast  Result Date: 11/06/2017 CLINICAL DATA:  Newly diagnosed endometrial carcinoma. Staging. One month status post TAH-BSO. EXAM: CT CHEST, ABDOMEN, AND PELVIS WITH CONTRAST TECHNIQUE: Multidetector CT imaging of the chest, abdomen and pelvis was performed following the standard protocol during bolus administration of intravenous contrast. CONTRAST:  124m ISOVUE-300 IOPAMIDOL (ISOVUE-300) INJECTION 61%  COMPARISON:  AP CT on 09/19/2017 FINDINGS: CT CHEST FINDINGS Cardiovascular: No acute findings. Mediastinum/Lymph Nodes: No masses or pathologically enlarged lymph nodes identified. Lungs/Pleura: No pulmonary infiltrate or mass identified. No effusion present. Musculoskeletal:  No suspicious bone lesions identified. CT ABDOMEN AND PELVIS FINDINGS Hepatobiliary: No masses identified. Gallbladder is unremarkable. Pancreas:  No mass or inflammatory changes. Spleen:  Within normal limits in size and appearance. Adrenals/Urinary tract:  No masses or hydronephrosis. Stomach/Bowel: No evidence of obstruction, inflammatory process, or abnormal fluid collections. Vascular/Lymphatic: No pathologically enlarged lymph nodes identified. No abdominal aortic aneurysm. Reproductive: Expected postop changes seen from  recent hysterectomy. No pelvic mass, inflammatory process, or abnormal fluid collections identified. Other:  None. Musculoskeletal:  No suspicious bone lesions identified. IMPRESSION: Expected postop changes from TAH-BSO. No complication identified. No evidence of residual or metastatic carcinoma. Electronically Signed   By: Earle Gell M.D.   On: 11/06/2017 16:07   Ct Abdomen Pelvis W Contrast  Result Date: 11/06/2017 CLINICAL DATA:  Newly diagnosed endometrial carcinoma. Staging. One month status post TAH-BSO. EXAM: CT CHEST, ABDOMEN, AND PELVIS WITH CONTRAST TECHNIQUE: Multidetector CT imaging of the chest, abdomen and pelvis was performed following the standard protocol during bolus administration of intravenous contrast. CONTRAST:  122m ISOVUE-300 IOPAMIDOL (ISOVUE-300) INJECTION 61% COMPARISON:  AP CT on 09/19/2017 FINDINGS: CT CHEST FINDINGS Cardiovascular: No acute findings. Mediastinum/Lymph Nodes: No masses or pathologically enlarged lymph nodes identified. Lungs/Pleura: No pulmonary infiltrate or mass identified. No effusion present. Musculoskeletal:  No suspicious bone lesions identified. CT ABDOMEN AND PELVIS FINDINGS Hepatobiliary: No masses identified. Gallbladder is unremarkable. Pancreas:  No mass or inflammatory changes. Spleen:  Within normal limits in size and appearance. Adrenals/Urinary tract:  No masses or hydronephrosis. Stomach/Bowel: No evidence of obstruction, inflammatory process, or abnormal fluid collections. Vascular/Lymphatic: No pathologically enlarged lymph nodes identified. No abdominal aortic aneurysm. Reproductive: Expected postop changes seen from recent hysterectomy. No pelvic mass, inflammatory process, or abnormal fluid collections identified. Other:  None. Musculoskeletal:  No suspicious bone lesions identified. IMPRESSION: Expected postop changes from TAH-BSO. No complication identified. No evidence of residual or metastatic carcinoma. Electronically Signed   By: JEarle GellM.D.   On: 11/06/2017 16:07   Ir Imaging Guided Port Insertion  Result Date: 11/08/2017 INDICATION: 62year old female with a history of endometrial carcinoma, referred for port catheter EXAM: IMPLANTED PORT A CATH PLACEMENT WITH ULTRASOUND AND FLUOROSCOPIC GUIDANCE MEDICATIONS: 2 g Ancef; The antibiotic was administered within an appropriate time interval prior to skin puncture. ANESTHESIA/SEDATION: Moderate (conscious) sedation was employed during this procedure. A total of Versed 1.0 mg and Fentanyl 50 mcg was administered intravenously. Moderate Sedation Time: 20 minutes. The patient's level of consciousness and vital signs were monitored continuously by radiology nursing throughout the procedure under my direct supervision. FLUOROSCOPY TIME:  0 minutes, 6 seconds (1 mGy) COMPLICATIONS: None PROCEDURE: The procedure, risks, benefits, and alternatives were explained to the patient. Questions regarding the procedure were encouraged and answered. The patient understands and consents to the procedure. Ultrasound survey was performed with images stored and sent to PACs. The right neck and chest was prepped with chlorhexidine, and draped in the usual sterile fashion using maximum barrier technique (cap and mask, sterile gown, sterile gloves, large sterile sheet, hand hygiene and cutaneous antiseptic). Antibiotic prophylaxis was provided with 2.0g Ancef administered IV one hour prior to skin incision. Local anesthesia was attained by infiltration with 1% lidocaine without epinephrine. Ultrasound demonstrated patency of the right internal jugular vein, and this was documented  with an image. Under real-time ultrasound guidance, this vein was accessed with a 21 gauge micropuncture needle and image documentation was performed. A small dermatotomy was made at the access site with an 11 scalpel. A 0.018" wire was advanced into the SVC and used to estimate the length of the internal catheter. The access needle  exchanged for a 56F micropuncture vascular sheath. The 0.018" wire was then removed and a 0.035" wire advanced into the IVC. An appropriate location for the subcutaneous reservoir was selected below the clavicle and an incision was made through the skin and underlying soft tissues. The subcutaneous tissues were then dissected using a combination of blunt and sharp surgical technique and a pocket was formed. A single lumen power injectable portacatheter was then tunneled through the subcutaneous tissues from the pocket to the dermatotomy and the port reservoir placed within the subcutaneous pocket. The venous access site was then serially dilated and a peel away vascular sheath placed over the wire. The wire was removed and the port catheter advanced into position under fluoroscopic guidance. The catheter tip is positioned in the cavoatrial junction. This was documented with a spot image. The portacatheter was then tested and found to flush and aspirate well. The port was flushed with saline followed by 100 units/mL heparinized saline. The pocket was then closed in two layers using first subdermal inverted interrupted absorbable sutures followed by a running subcuticular suture. The epidermis was then sealed with Dermabond. The dermatotomy at the venous access site was also seal with Dermabond. Patient tolerated the procedure well and remained hemodynamically stable throughout. No complications encountered and no significant blood loss encountered IMPRESSION: Status post right IJ port catheter placement. Catheter ready for use. Signed, Dulcy Fanny. Dellia Nims, RPVI [facility] Radiology Electronically Signed   By: Corrie Mckusick D.O.   On: 11/08/2017 11:42    All questions were answered. The patient knows to call the clinic with any problems, questions or concerns. No barriers to learning was detected.  I spent 15 minutes counseling the patient face to face. The total  time spent in the appointment was 20 minutes and more than 50% was on counseling and review of test results  Heath Lark, MD 11/30/2017 1:39 PM

## 2017-11-30 NOTE — Assessment & Plan Note (Signed)

## 2017-11-30 NOTE — Assessment & Plan Note (Signed)
The patient is at risk of uncontrolled diabetes while on treatment We will monitor her blood sugar carefully during treatment

## 2017-11-30 NOTE — Assessment & Plan Note (Signed)
She is noted to have mild hypokalemia I recommend potassium rich diet rather than potassium supplement

## 2017-11-30 NOTE — Assessment & Plan Note (Signed)
She tolerated cycle 1 of chemotherapy well except for some mild fatigue She denies worsening peripheral neuropathy We will proceed with treatment without dose adjustment

## 2017-12-01 ENCOUNTER — Other Ambulatory Visit: Payer: Self-pay | Admitting: Hematology and Oncology

## 2017-12-01 ENCOUNTER — Other Ambulatory Visit: Payer: Self-pay | Admitting: Hematology

## 2017-12-01 MED ORDER — DEXAMETHASONE 4 MG PO TABS: ORAL_TABLET | ORAL | 0 refills | Status: AC

## 2017-12-03 ENCOUNTER — Inpatient Hospital Stay: Payer: BLUE CROSS/BLUE SHIELD

## 2017-12-03 VITALS — BP 146/85 | HR 94 | Temp 98.3°F | Resp 16 | Ht 66.0 in | Wt 259.0 lb

## 2017-12-03 DIAGNOSIS — Z5111 Encounter for antineoplastic chemotherapy: Secondary | ICD-10-CM | POA: Diagnosis not present

## 2017-12-03 DIAGNOSIS — C541 Malignant neoplasm of endometrium: Secondary | ICD-10-CM

## 2017-12-03 MED ORDER — HEPARIN SOD (PORK) LOCK FLUSH 100 UNIT/ML IV SOLN
500.0000 [IU] | Freq: Once | INTRAVENOUS | Status: AC | PRN
  Administered 2017-12-03: 500 [IU]
  Filled 2017-12-03: qty 5

## 2017-12-03 MED ORDER — FAMOTIDINE IN NACL 20-0.9 MG/50ML-% IV SOLN
20.0000 mg | Freq: Once | INTRAVENOUS | Status: AC
  Administered 2017-12-03: 20 mg via INTRAVENOUS

## 2017-12-03 MED ORDER — SODIUM CHLORIDE 0.9 % IV SOLN
140.0000 mg/m2 | Freq: Once | INTRAVENOUS | Status: AC
  Administered 2017-12-03: 324 mg via INTRAVENOUS
  Filled 2017-12-03: qty 54

## 2017-12-03 MED ORDER — FOSAPREPITANT DIMEGLUMINE INJECTION 150 MG
Freq: Once | INTRAVENOUS | Status: AC
  Administered 2017-12-03: 10:00:00 via INTRAVENOUS
  Filled 2017-12-03: qty 5

## 2017-12-03 MED ORDER — DIPHENHYDRAMINE HCL 50 MG/ML IJ SOLN
INTRAMUSCULAR | Status: AC
  Filled 2017-12-03: qty 1

## 2017-12-03 MED ORDER — SODIUM CHLORIDE 0.9 % IV SOLN
Freq: Once | INTRAVENOUS | Status: AC
  Administered 2017-12-03: 09:00:00 via INTRAVENOUS
  Filled 2017-12-03: qty 250

## 2017-12-03 MED ORDER — PALONOSETRON HCL INJECTION 0.25 MG/5ML
INTRAVENOUS | Status: AC
  Filled 2017-12-03: qty 5

## 2017-12-03 MED ORDER — SODIUM CHLORIDE 0.9 % IV SOLN
750.0000 mg | Freq: Once | INTRAVENOUS | Status: AC
  Administered 2017-12-03: 750 mg via INTRAVENOUS
  Filled 2017-12-03: qty 75

## 2017-12-03 MED ORDER — FAMOTIDINE IN NACL 20-0.9 MG/50ML-% IV SOLN
INTRAVENOUS | Status: AC
  Filled 2017-12-03: qty 50

## 2017-12-03 MED ORDER — DIPHENHYDRAMINE HCL 50 MG/ML IJ SOLN
50.0000 mg | Freq: Once | INTRAMUSCULAR | Status: AC
  Administered 2017-12-03: 50 mg via INTRAVENOUS

## 2017-12-03 MED ORDER — SODIUM CHLORIDE 0.9% FLUSH
10.0000 mL | INTRAVENOUS | Status: DC | PRN
  Administered 2017-12-03: 10 mL
  Filled 2017-12-03: qty 10

## 2017-12-03 MED ORDER — PALONOSETRON HCL INJECTION 0.25 MG/5ML
0.2500 mg | Freq: Once | INTRAVENOUS | Status: AC
  Administered 2017-12-03: 0.25 mg via INTRAVENOUS

## 2017-12-03 NOTE — Patient Instructions (Signed)
[place] Discharge Instructions for Patients Receiving Chemotherapy  Today you received the following chemotherapy agents:  TAxol, Carboplatin  To help prevent nausea and vomiting after your treatment, we encourage you to take your nausea medication as prescribed.   If you develop nausea and vomiting that is not controlled by your nausea medication, call the clinic.   BELOW ARE SYMPTOMS THAT SHOULD BE REPORTED IMMEDIATELY:  *FEVER GREATER THAN 100.5 F  *CHILLS WITH OR WITHOUT FEVER  NAUSEA AND VOMITING THAT IS NOT CONTROLLED WITH YOUR NAUSEA MEDICATION  *UNUSUAL SHORTNESS OF BREATH  *UNUSUAL BRUISING OR BLEEDING  TENDERNESS IN MOUTH AND THROAT WITH OR WITHOUT PRESENCE OF ULCERS  *URINARY PROBLEMS  *BOWEL PROBLEMS  UNUSUAL RASH Items with * indicate a potential emergency and should be followed up as soon as possible.  Feel free to call the clinic should you have any questions or concerns. The clinic phone number is [phone].  Please show the [place] at check-in to the Emergency Department and triage nurse.

## 2017-12-10 ENCOUNTER — Telehealth: Payer: Self-pay | Admitting: Oncology

## 2017-12-10 NOTE — Telephone Encounter (Signed)
Called Kim in [facility] and requested an addendum on accession [id number] to reflect pathologic stage classification of IIIC1. Maudie Mercury said she would send a message to Dr. Vic Ripper.

## 2017-12-11 ENCOUNTER — Telehealth: Payer: Self-pay | Admitting: *Deleted

## 2017-12-11 NOTE — Telephone Encounter (Signed)
CALLED PATIENT TO REMIND OF NEW HDR [facility] FOR 12-12-17, SPOKE WITH PATIENT AND SHE IS AWARE OF THESE APPTS.

## 2017-12-12 ENCOUNTER — Other Ambulatory Visit: Payer: Self-pay

## 2017-12-12 ENCOUNTER — Encounter: Payer: Self-pay | Admitting: Radiation Oncology

## 2017-12-12 ENCOUNTER — Ambulatory Visit
Admission: RE | Admit: 2017-12-12 | Discharge: 2017-12-12 | Disposition: A | Discharge status: Home / Self Care | Payer: BLUE CROSS/BLUE SHIELD | Source: Ambulatory Visit | Attending: Radiation Oncology | Admitting: Radiation Oncology

## 2017-12-12 ENCOUNTER — Encounter: Payer: Self-pay | Admitting: Oncology

## 2017-12-12 VITALS — BP 129/88 | HR 116 | Temp 98.7°F | Resp 20 | Ht 66.0 in | Wt 255.4 lb

## 2017-12-12 DIAGNOSIS — Z79899 Other long term (current) drug therapy: Secondary | ICD-10-CM | POA: Diagnosis not present

## 2017-12-12 DIAGNOSIS — Z7982 Long term (current) use of aspirin: Secondary | ICD-10-CM | POA: Insufficient documentation

## 2017-12-12 DIAGNOSIS — C541 Malignant neoplasm of endometrium: Secondary | ICD-10-CM | POA: Insufficient documentation

## 2017-12-12 DIAGNOSIS — Z510 Encounter for antineoplastic radiation therapy: Secondary | ICD-10-CM | POA: Diagnosis not present

## 2017-12-12 NOTE — Progress Notes (Signed)
Pt arrives for new [facility] HDR with Dr. Sondra Come. Pt is accompanied by god daughter. Pt denies c/o pain. Pt denies dysuria/hematuria. Pt denies vaginal discharge/bleeding. Pt denies rectal bleeding. Pt c/o constipation. OTC recommendations made to pt.   BP 129/88 (BP Location: Right Wrist, Patient Position: Sitting)   Pulse (!) 116   Temp 98.7 F (37.1 C) (Oral)   Resp 20   Ht 5\' 6"  (1.676 m)   Wt 255 lb 6.4 oz (115.8 kg)   SpO2 99%   BMI 41.22 kg/m   BP 129/88 (BP Location: Right Wrist, Patient Position: Sitting)   Pulse (!) 116   Temp 98.7 F (37.1 C) (Oral)   Resp 20   Ht 5\' 6"  (1.676 m)   Wt 255 lb 6.4 oz (115.8 kg)   SpO2 99%   BMI 41.22 kg/m   Loma Sousa, RN BSN

## 2017-12-12 NOTE — Progress Notes (Signed)
Radiation Oncology         [phone] ________________________________  Name: Morgan Vincent MRN: [id number]  Date: 12/12/2017  DOB: 06-Sep-1955  Vaginal Brachytherapy Procedure Note  CC: [name], MD Everitt Amber, MD    ICD-10-CM   1. Endometrial cancer (HCC) C54.1     Diagnosis: Stage IIIC1 (pT1a, pN1a, cM0) invasive endometrioid adenocarcinoma, grade 2, with LVSI  Narrative: She returns today for vaginal cylinder fitting. She is accompanied by a family member today. She has been doing well overall.   Since her last visit to the office, she underwent CT AP w contrast on 11/06/2017 with results showing: Expected postop changes from TAH-BSO. No complication identified. No evidence of residual or metastatic carcinoma.  She had her second chemotherapy treatment last week and she notes that she has had been treated with carboplatin and taxol with Medical Oncologist, Dr. Alvy Bimler.   On review of systems, she reports nausea, vomiting, constipation, palpitations this past week. She reports that she has had these symptoms along with chemotherapy treatments. She is unsure if the palpitations is due to her being nervous this week.  she denies diarrhea, vaginal bleeding, and any other symptoms. Pertinent positives are listed and detailed within the above HPI.   ALLERGIES: has No Known Allergies.  Meds: Current Outpatient Medications  Medication Sig Dispense Refill  . acetaminophen (TYLENOL) 325 MG tablet Take 650 mg by mouth every 6 (six) hours as needed for mild pain.    Marland Kitchen albuterol (PROVENTIL HFA;VENTOLIN HFA) 108 (90 Base) MCG/ACT inhaler Inhale 2 puffs into the lungs every 6 (six) hours as needed for wheezing or shortness of breath.    . Ascorbic Acid (VITAMIN C) 1000 MG tablet Take 1,000 mg by mouth daily.    Marland Kitchen CALCIUM PO Take 1 tablet by mouth daily.    Marland Kitchen dexamethasone (DECADRON) 4 MG tablet Take 3 tabs at the night before and 3 tabs the morning of chemotherapy, every 3 weeks,  by mouth 36 tablet 0  . hydrochlorothiazide (HYDRODIURIL) 50 MG tablet Take 50 mg by mouth daily    . ibuprofen (ADVIL,MOTRIN) 800 MG tablet ibuprofen 800 mg tablet    . lidocaine-prilocaine (EMLA) cream Apply to affected area once 30 g 3  . loratadine (CLARITIN) 10 MG tablet loratadine 10 mg tablet  TAKE 1 TABLET (10 MG TOTAL) BY MOUTH DAILY.    Marland Kitchen ondansetron (ZOFRAN) 8 MG tablet Take 1 tablet (8 mg total) by mouth every 8 (eight) hours as needed for refractory nausea / vomiting. Start on day 3 after chemo. 30 tablet 1  . Potassium 99 MG TABS Take 99 mg by mouth daily.    Marland Kitchen PROBIOTIC PRODUCT PO Take 1 capsule by mouth daily.     . prochlorperazine (COMPAZINE) 10 MG tablet Take 1 tablet (10 mg total) by mouth every 6 (six) hours as needed (Nausea or vomiting). 30 tablet 1  . vitamin E 1000 UNIT capsule Take 1,000 Units by mouth daily.    Marland Kitchen zinc gluconate 50 MG tablet Take 50 mg by mouth daily.    Marland Kitchen aspirin 81 MG tablet Take 81 mg by mouth daily.    Marland Kitchen enoxaparin (LOVENOX) 40 MG/0.4ML injection Inject 0.4 mLs (40 mg total) into the skin daily. (Patient not taking: Reported on 12/12/2017) 10 Syringe 2  . nystatin-triamcinolone ointment (MYCOLOG) Apply 1 application topically 2 (two) times daily. (Patient not taking: Reported on 12/12/2017) 30 g 0   No current facility-administered medications for this encounter.  Physical Findings: The patient is in no acute distress. Patient is alert and oriented.  height is 5\' 6"  (1.676 m) and weight is 255 lb 6.4 oz (115.8 kg). Her oral temperature is 98.7 F (37.1 C). Her blood pressure is 129/88 and her pulse is 116 (abnormal). Her respiration is 20 and oxygen saturation is 99%.   No palpable cervical, supraclavicular or axillary lymphoadenopathy. The heart has a regular rate and rhythm. The lungs are clear to auscultation. Abdomen soft and non-tender.  On pelvic examination the external genitalia were unremarkable. A speculum exam was performed. Vaginal  cuff intact, no mucosal lesions. On bimanual exam there were no pelvic masses appreciated.   Lab Findings: Lab Results  Component Value Date   WBC 5.1 11/30/2017   HGB 11.5 (L) 11/30/2017   HCT 34.7 (L) 11/30/2017   MCV 82.2 11/30/2017   PLT 204 11/30/2017    Radiographic Findings: No results found.  Impression:   Patient was fitted for a vaginal cylinder. The patient will be treated with a 3 cm diameter cylinder with a treatment length of 3.5 cm. This distended the vaginal vault without undue discomfort. The patient tolerated the procedure well.  The patient was successfully fitted for a vaginal cylinder. The patient is appropriate to begin vaginal brachytherapy.   Plan: The patient will proceed with CT simulation and her first vaginal brachytherapy treatment today.    _______________________________   Morgan Promise, PhD, MD  This document serves as a record of services personally performed by Gery Pray, MD. It was created on his behalf by [facility], a trained medical scribe. The creation of this record is based on the scribe's personal observations and the provider's statements to them. This document has been checked and approved by the attending provider.

## 2017-12-12 NOTE — Progress Notes (Signed)
  Radiation Oncology         [phone] ________________________________  Name: Morgan Vincent MRN: [id number]  Date: 12/12/2017  DOB: Jan 16, 1956  SIMULATION AND TREATMENT PLANNING NOTE HDR BRACHYTHERAPY  DIAGNOSIS:  Stage IIIC1 (pT1a, pN1a, cM0) invasive endometrioid adenocarcinoma, grade 2, with LVSI  NARRATIVE:  The patient was brought to the [place].  Identity was confirmed.  All relevant records and images related to the planned course of therapy were reviewed.  The patient freely provided informed written consent to proceed with treatment after reviewing the details related to the planned course of therapy. The consent form was witnessed and verified by the simulation staff.  Then, the patient was set-up in a stable reproducible  supine position for radiation therapy.  CT images were obtained.  Surface markings were placed.  The CT images were loaded into the planning software.  Then the target and avoidance structures were contoured.  Treatment planning then occurred.  The radiation prescription was entered and confirmed.   I have requested : Brachytherapy Isodose Plan and Dosimetry Calculations to plan the radiation distribution.    PLAN:  The patient will receive 30 Gy in 5 fractions.  The patient will be treated with a 3.0 diameter segmented cylinder with a treatment length of 3.5 cm. Prescription will be to the vaginal mucosal surface. Iridium 192 will be the high-dose-rate source.    ________________________________  Blair Promise, PhD, MD   This document serves as a record of services personally performed by Gery Pray, MD. It was created on his behalf by [facility], a trained medical scribe. The creation of this record is based on the scribe's personal observations and the provider's statements to them. This document has been checked and approved by the attending provider.

## 2017-12-12 NOTE — Progress Notes (Signed)
  Radiation Oncology         [phone] ________________________________  Name: MCKENLEY BIRENBAUM MRN: [id number]  Date: 12/12/2017  DOB: 1956/01/13  CC: [name], MD  Everitt Amber, MD  HDR BRACHYTHERAPY NOTE  DIAGNOSIS: Stage IIIC1 (pT1a, pN1a, cM0) invasive endometrioid adenocarcinoma, grade 2, with LVSI   Simple treatment device note:  Patient had construction of her custom vaginal cylinder. She will be treated with a 3 cm diameter segmented cylinder. This conforms to her anatomy without undue discomfort.  Vaginal brachytherapy procedure node: The patient was brought to the [place] suite. Identity was confirmed. All relevant records and images related to the planned course of therapy were reviewed. The patient freely provided informed written consent to proceed with treatment after reviewing the details related to the planned course of therapy. The consent form was witnessed and verified by the simulation staff. Then, the patient was set-up in a stable reproducible supine position for radiation therapy. Pelvic exam revealed the vaginal cuff to be intact. The patient's custom vaginal cylinder was placed in the proximal vagina. This was affixed to the CT/MR stabilization plate to prevent slippage. Patient tolerated the placement well.  Verification simulation note:  A fiducial marker was placed within the vaginal cylinder. An AP and lateral film was then obtained through the pelvis area. This documented accurate position of the vaginal cylinder for treatment.  HDR BRACHYTHERAPY TREATMENT  The remote afterloading device was affixed to the vaginal cylinder by catheter. Patient then proceeded to undergo her first high-dose-rate treatment directed at the proximal vagina. The patient was prescribed a dose of 30 gray to be delivered to the mucosal surface. Treatment length was 3.5 cm. Patient was treated with 1 channel using 8 dwell positions. Treatment time was 279.30 seconds. Iridium 192 was  the high-dose-rate source for treatment. The patient tolerated the treatment well. After completion of her therapy, a radiation survey was performed documenting return of the iridium source into the GammaMed safe.   PLAN: The patient will return next week for her second high-dose-rate treatment ________________________________   Blair Promise, PhD, MD   This document serves as a record of services personally performed by Gery Pray, MD. It was created on his behalf by [facility], a trained medical scribe. The creation of this record is based on the scribe's personal observations and the provider's statements to them. This document has been checked and approved by the attending provider.

## 2017-12-14 ENCOUNTER — Telehealth: Payer: Self-pay | Admitting: *Deleted

## 2017-12-14 NOTE — Telephone Encounter (Signed)
CALLED PATIENT TO REMIND OF HDR TX. FOR 12-17-17 @ 9 AM , LVM FOR A RETURN CALL

## 2017-12-17 ENCOUNTER — Ambulatory Visit
Admission: RE | Admit: 2017-12-17 | Discharge: 2017-12-17 | Disposition: A | Discharge status: Home / Self Care | Payer: BLUE CROSS/BLUE SHIELD | Source: Ambulatory Visit | Attending: Radiation Oncology | Admitting: Radiation Oncology

## 2017-12-17 DIAGNOSIS — C541 Malignant neoplasm of endometrium: Secondary | ICD-10-CM | POA: Diagnosis not present

## 2017-12-17 NOTE — Progress Notes (Signed)
  Radiation Oncology         [phone] ________________________________  Name: EILLEEN DAVOLI MRN: [id number]  Date: 12/17/2017  DOB: [birth year]  CC: [name], MD  Everitt Amber, MD  HDR BRACHYTHERAPY NOTE  DIAGNOSIS: Stage IIIC1 (pT1a, pN1a, cM0) invasive endometrioid adenocarcinoma, grade 2, with LVSI   Simple treatment device note:  Patient had construction of her custom vaginal cylinder. She will be treated with a 3 cm diameter segmented cylinder. This conforms to her anatomy without undue discomfort.  Vaginal brachytherapy procedure node: The patient was brought to the [place] suite. Identity was confirmed. All relevant records and images related to the planned course of therapy were reviewed. The patient freely provided informed written consent to proceed with treatment after reviewing the details related to the planned course of therapy. The consent form was witnessed and verified by the simulation staff. Then, the patient was set-up in a stable reproducible supine position for radiation therapy. Pelvic exam revealed the vaginal cuff to be intact. The patient's custom vaginal cylinder was placed in the proximal vagina. This was affixed to the CT/MR stabilization plate to prevent slippage. Patient tolerated the placement well.  Verification simulation note:  A fiducial marker was placed within the vaginal cylinder. An AP and lateral film was then obtained through the pelvis area. This documented accurate position of the vaginal cylinder for treatment.  HDR BRACHYTHERAPY TREATMENT  The remote afterloading device was affixed to the vaginal cylinder by catheter. Patient then proceeded to undergo her second high-dose-rate treatment directed at the proximal vagina. The patient was prescribed a dose of 30 gray to be delivered to the mucosal surface. Treatment length was 3.5 cm. Patient was treated with 1 channel using 8 dwell positions. Treatment time was 292.50 seconds. Iridium 192 was  the high-dose-rate source for treatment. The patient tolerated the treatment well. After completion of her therapy, a radiation survey was performed documenting return of the iridium source into the GammaMed safe.   PLAN: The patient will return next week for her third high-dose-rate treatment ________________________________   Blair Promise, PhD, MD   This document serves as a record of services personally performed by Gery Pray, MD. It was created on his behalf by [facility], a trained medical scribe. The creation of this record is based on the scribe's personal observations and the provider's statements to them. This document has been checked and approved by the attending provider.

## 2017-12-21 ENCOUNTER — Telehealth: Payer: Self-pay | Admitting: *Deleted

## 2017-12-21 ENCOUNTER — Encounter: Payer: Self-pay | Admitting: Hematology and Oncology

## 2017-12-21 ENCOUNTER — Inpatient Hospital Stay: Payer: BLUE CROSS/BLUE SHIELD

## 2017-12-21 ENCOUNTER — Inpatient Hospital Stay (HOSPITAL_BASED_OUTPATIENT_CLINIC_OR_DEPARTMENT_OTHER): Payer: BLUE CROSS/BLUE SHIELD | Admitting: Hematology and Oncology

## 2017-12-21 DIAGNOSIS — D61818 Other pancytopenia: Secondary | ICD-10-CM | POA: Diagnosis not present

## 2017-12-21 DIAGNOSIS — E119 Type 2 diabetes mellitus without complications: Secondary | ICD-10-CM | POA: Diagnosis not present

## 2017-12-21 DIAGNOSIS — L609 Nail disorder, unspecified: Secondary | ICD-10-CM

## 2017-12-21 DIAGNOSIS — C541 Malignant neoplasm of endometrium: Secondary | ICD-10-CM

## 2017-12-21 DIAGNOSIS — E876 Hypokalemia: Secondary | ICD-10-CM | POA: Diagnosis not present

## 2017-12-21 DIAGNOSIS — G620 Drug-induced polyneuropathy: Secondary | ICD-10-CM

## 2017-12-21 DIAGNOSIS — Z5111 Encounter for antineoplastic chemotherapy: Secondary | ICD-10-CM | POA: Diagnosis not present

## 2017-12-21 LAB — CMP (CANCER CENTER ONLY)
ALT: 19 U/L (ref 0–44)
AST: 17 U/L (ref 15–41)
Albumin: 3.9 g/dL (ref 3.5–5.0)
Alkaline Phosphatase: 92 U/L (ref 38–126)
Anion gap: 13 (ref 5–15)
BUN: 15 mg/dL (ref 8–23)
CHLORIDE: 100 mmol/L (ref 98–111)
CO2: 30 mmol/L (ref 22–32)
Calcium: 10.3 mg/dL (ref 8.9–10.3)
Creatinine: 0.84 mg/dL (ref 0.44–1.00)
Glucose, Bld: 110 mg/dL — ABNORMAL HIGH (ref 70–99)
POTASSIUM: 3 mmol/L — AB (ref 3.5–5.1)
SODIUM: 143 mmol/L (ref 135–145)
Total Bilirubin: <0.2 mg/dL — ABNORMAL LOW (ref 0.3–1.2)
Total Protein: 8 g/dL (ref 6.5–8.1)

## 2017-12-21 LAB — CBC WITH DIFFERENTIAL (CANCER CENTER ONLY)
BASOS PCT: 0 %
Basophils Absolute: 0 10*3/uL (ref 0.0–0.1)
EOS ABS: 0 10*3/uL (ref 0.0–0.5)
EOS PCT: 0 %
HCT: 34 % — ABNORMAL LOW (ref 34.8–46.6)
Hemoglobin: 11.5 g/dL — ABNORMAL LOW (ref 11.6–15.9)
LYMPHS ABS: 1.5 10*3/uL (ref 0.9–3.3)
Lymphocytes Relative: 29 %
MCH: 27.3 pg (ref 25.1–34.0)
MCHC: 33.8 g/dL (ref 31.5–36.0)
MCV: 80.6 fL (ref 79.5–101.0)
MONO ABS: 0.6 10*3/uL (ref 0.1–0.9)
MONOS PCT: 12 %
Neutro Abs: 3 10*3/uL (ref 1.5–6.5)
Neutrophils Relative %: 59 %
PLATELETS: 133 10*3/uL — AB (ref 145–400)
RBC: 4.22 MIL/uL (ref 3.70–5.45)
RDW: 17.4 % — AB (ref 11.2–14.5)
WBC Count: 5.2 10*3/uL (ref 3.9–10.3)

## 2017-12-21 NOTE — Assessment & Plan Note (Signed)
She tolerated treatment well without major side effects from treatment apart from some nail changes and very mild neuropathy and pancytopenia We will proceed with treatment without dose adjustment I reassured her that her new changes will resolve after discontinuation of treatment

## 2017-12-21 NOTE — Telephone Encounter (Signed)
-----   Message from Heath Lark, MD sent at 12/21/2017 11:37 AM EDT ----- Regarding: low potassium Tell her to take her potassium supplement and potassium rich diet ----- Message ----- From: Interface, Lab In [place] Sent: 12/21/2017   9:24 AM EDT To: Heath Lark, MD

## 2017-12-21 NOTE — Telephone Encounter (Signed)
Notified of message below. Verbalized understanding 

## 2017-12-22 DIAGNOSIS — D61818 Other pancytopenia: Secondary | ICD-10-CM | POA: Insufficient documentation

## 2017-12-22 NOTE — Assessment & Plan Note (Signed)
Likely due to poor oral intake She will continue potassium replacement along with potassium rich diet

## 2017-12-22 NOTE — Progress Notes (Signed)
[place] OFFICE PROGRESS NOTE  Patient Care Team: [name], MD as PCP - General (Geriatric Medicine) Ena Dawley, MD as Consulting Physician (Obstetrics and Gynecology)  ASSESSMENT & PLAN:  Endometrial cancer [facility]) She tolerated treatment well without major side effects from treatment apart from some nail changes and very mild neuropathy and pancytopenia We will proceed with treatment without dose adjustment I reassured her that her new changes will resolve after discontinuation of treatment  Pancytopenia, acquired [facility]) She has mild pancytopenia due to treatment She is not symptomatic.  Observe only  Hypokalemia, inadequate intake Likely due to poor oral intake She will continue potassium replacement along with potassium rich diet   No orders of the defined types were placed in this encounter.   INTERVAL HISTORY: Please see below for problem oriented charting. She returns for cycle 3 of chemotherapy Since the last time I saw her, she feels well Denies nausea No significant constipation She complained of some minor new changes No peripheral neuropathy Denies recent fever or chills  SUMMARY OF ONCOLOGIC HISTORY: Oncology History   MSI stable Endometrioid     Endometrial cancer ([place])   09/19/2017 Initial Diagnosis    A CT scan of the abdomen and pelvis was performed which revealed mild inflammation involving distal small bowel loops including the terminal ileum with no obstruction, diverticulosis without definite diverticulitis.  No abscess.  The uterus was markedly abnormal with large heterogeneous soft tissues within the endometrial cavity.  There are multiple calcified fibroids identified.  She was presumed to have a diagnosis of gastroenteritis and was discharged home with recommendation to follow-up with her OB/GYN provider for work-up of her abnormal uterine findings.  She saw Dr. Raphael Gibney on May 31st, 2019 and a Pap smear was obtained  which revealed atypical glandular cells, HPV negative.  An endometrial biopsy was performed which revealed minute fragments of malignant neoplasm, favor high-grade carcinoma.  There is small amount of of tissue was too small for further evaluation with immunohistochemistry     10/15/2017 Pathology Results    1. Lymph node, sentinel, biopsy, right obturator AMENDED DIAGNOSIS: - METASTATIC ADENOCARCINOMA TO LYMPH NODE (1/1). (CONFIRMED WITH IMMUNOHISTOCHEMICAL STAIN FOR PANKERATIN). 2. Lymph nodes, regional resection, left pelvic - SIX LYMPH NODES, NEGATIVE FOR CARCINOMA (0/6). 3. Lymph nodes, regional resection, left para aortic - THREE LYMPH NODES, NEGATIVE FOR CARCINOMA (0/3). 4. 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 - CARCINOMA INVADES FOR A DEPTH OF 1.0 CM WHERE MYOMETRIAL THICKNESS IS 3.0 CM (LESS THAN 50%). - TUMOR DOES NOT INVOLVE CERVIX OF SEROSAL SURFACE. - LYMPHOVASCULAR INVASION IS PRESENT. - BENIGN LEIOMYOMATA. - BENIGN BILATERAL OVARIES AND FALLOPIAN TUBES. - SEE ONCOLOGY TABLE. Microscopic Comment 4. UTERUS, CARCINOMA OR CARCINOSARCOMA Procedure: Total hysterectomy with bilateral salpingo-oophorectomy. Histologic type: Endometrioid adenocarcinoma. Histologic Grade: FIGO grade II. Myometrial invasion: Depth of invasion: 10 mm Myometrial thickness: 30 mm Uterine Serosa Involvement: Not identified. Cervical stromal involvement: Not identified. Extent of involvement of other organs: N/A. Lymphovascular invasion: Present. Regional Lymph Nodes: Examined: 1 Sentinel 9 Non-sentinel 10 Total Lymph nodes with metastasis: 1 Isolated tumor cells: 0 Micrometastasis: 0 Macrometastasis: 1 Extranodal extension: not present. Tumor block for ancillary studies: 71F and 4G. MMR / MSI testing: Pending. Pathologic Stage Classification (pTNM,  AJCC 8th edition): pT1a, pN1a. (v4.1.0.0) Diagnosis Note 4. Immunohistochemical stains for MMR-related proteins and molecular studies for microsatellite instability are pending and will be  reported in an addendum. (NDK:gt, 10/17/17)     10/15/2017 Surgery    Surgeon: Donaciano Eva    Operation: Robotic-assisted laparoscopic total hysterectomy >250gm with bilateral salpingoophorectomy, SLN biopsy, pelvic and para-aortic lymphadenectomy  Operative Findings:  : 14cm bulky fibroid uterus with adhesions to appendix. Slightly enlarged right obturator SLN. Unilateral mapping to right side.  No gross extrauterine disease.       10/29/2017 Cancer Staging    Staging form: Corpus Uteri - Carcinoma and Carcinosarcoma, AJCC 8th Edition - Pathologic: Stage IIIC1 (pT1a, pN1a, cM0) - Signed by Heath Lark, MD on 10/29/2017    11/06/2017 Imaging    Expected postop changes from TAH-BSO. No complication identified. No evidence of residual or metastatic carcinoma.    11/08/2017 Procedure    Status post right IJ port catheter placement. Catheter ready for use.    11/12/2017 -  Chemotherapy    The patient had carboplatin and taxol     REVIEW OF SYSTEMS:   Constitutional: Denies fevers, chills or abnormal weight loss Eyes: Denies blurriness of vision Ears, nose, mouth, throat, and face: Denies mucositis or sore throat Respiratory: Denies cough, dyspnea or wheezes Cardiovascular: Denies palpitation, chest discomfort or lower extremity swelling Gastrointestinal:  Denies nausea, heartburn or change in bowel habits Skin: Denies abnormal skin rashes Lymphatics: Denies new lymphadenopathy or easy bruising Neurological:Denies numbness, tingling or new weaknesses Behavioral/Psych: Mood is stable, no new changes  All other systems were reviewed with the patient and are negative.  I have reviewed the past medical history, past surgical history, social history and family history with the patient and they  are unchanged from previous note.  ALLERGIES:  has No Known Allergies.  MEDICATIONS:  Current Outpatient Medications  Medication Sig Dispense Refill  . acetaminophen (TYLENOL) 325 MG tablet Take 650 mg by mouth every 6 (six) hours as needed for mild pain.    Marland Kitchen albuterol (PROVENTIL HFA;VENTOLIN HFA) 108 (90 Base) MCG/ACT inhaler Inhale 2 puffs into the lungs every 6 (six) hours as needed for wheezing or shortness of breath.    . Ascorbic Acid (VITAMIN C) 1000 MG tablet Take 1,000 mg by mouth daily.    Marland Kitchen aspirin 81 MG tablet Take 81 mg by mouth daily.    Marland Kitchen CALCIUM PO Take 1 tablet by mouth daily.    Marland Kitchen dexamethasone (DECADRON) 4 MG tablet Take 3 tabs at the night before and 3 tabs the morning of chemotherapy, every 3 weeks, by mouth 36 tablet 0  . hydrochlorothiazide (HYDRODIURIL) 50 MG tablet Take 50 mg by mouth daily    . ibuprofen (ADVIL,MOTRIN) 800 MG tablet ibuprofen 800 mg tablet    . lidocaine-prilocaine (EMLA) cream Apply to affected area once 30 g 3  . loratadine (CLARITIN) 10 MG tablet loratadine 10 mg tablet  TAKE 1 TABLET (10 MG TOTAL) BY MOUTH DAILY.    Marland Kitchen ondansetron (ZOFRAN) 8 MG tablet Take 1 tablet (8 mg total) by mouth every 8 (eight) hours as needed for refractory nausea / vomiting. Start on day 3 after chemo. 30 tablet 1  . Potassium 99 MG TABS Take 99 mg by mouth daily.    Marland Kitchen PROBIOTIC PRODUCT PO Take 1 capsule by mouth daily.     . prochlorperazine (COMPAZINE) 10 MG tablet Take 1 tablet (10 mg total) by mouth every 6 (six) hours as needed (Nausea or vomiting). 30 tablet 1  . vitamin E 1000 UNIT capsule Take 1,000 Units by mouth daily.    Marland Kitchen zinc gluconate  50 MG tablet Take 50 mg by mouth daily.     No current facility-administered medications for this visit.     PHYSICAL EXAMINATION: ECOG PERFORMANCE STATUS: 1 - Symptomatic but completely ambulatory  Vitals:   12/21/17 0938  BP: (!) 145/66  Pulse: 100  Resp: 18  Temp: 98.5 F (36.9 C)  SpO2: 100%   Filed  Weights   12/21/17 0938  Weight: 253 lb 11.2 oz (115.1 kg)    GENERAL:alert, no distress and comfortable SKIN: skin color, texture, turgor are normal, no rashes or significant lesions EYES: normal, Conjunctiva are pink and non-injected, sclera clear OROPHARYNX:no exudate, no erythema and lips, buccal mucosa, and tongue normal  NECK: supple, thyroid normal size, non-tender, without nodularity LYMPH:  no palpable lymphadenopathy in the cervical, axillary or inguinal LUNGS: clear to auscultation and percussion with normal breathing effort HEART: regular rate & rhythm and no murmurs and no lower extremity edema ABDOMEN:abdomen soft, non-tender and normal bowel sounds Musculoskeletal:no cyanosis of digits and no clubbing  NEURO: alert & oriented x 3 with fluent speech, no focal motor/sensory deficits  LABORATORY DATA:  I have reviewed the data as listed    Component Value Date/Time   NA 143 12/21/2017 0906   K 3.0 (LL) 12/21/2017 0906   CL 100 12/21/2017 0906   CO2 30 12/21/2017 0906   GLUCOSE 110 (H) 12/21/2017 0906   BUN 15 12/21/2017 0906   CREATININE 0.84 12/21/2017 0906   CALCIUM 10.3 12/21/2017 0906   PROT 8.0 12/21/2017 0906   ALBUMIN 3.9 12/21/2017 0906   AST 17 12/21/2017 0906   ALT 19 12/21/2017 0906   ALKPHOS 92 12/21/2017 0906   BILITOT <0.2 (L) 12/21/2017 0906   GFRNONAA >60 12/21/2017 0906   GFRAA >60 12/21/2017 0906    No results found for: SPEP, UPEP  Lab Results  Component Value Date   WBC 5.2 12/21/2017   NEUTROABS 3.0 12/21/2017   HGB 11.5 (L) 12/21/2017   HCT 34.0 (L) 12/21/2017   MCV 80.6 12/21/2017   PLT 133 (L) 12/21/2017      Chemistry      Component Value Date/Time   NA 143 12/21/2017 0906   K 3.0 (LL) 12/21/2017 0906   CL 100 12/21/2017 0906   CO2 30 12/21/2017 0906   BUN 15 12/21/2017 0906   CREATININE 0.84 12/21/2017 0906      Component Value Date/Time   CALCIUM 10.3 12/21/2017 0906   ALKPHOS 92 12/21/2017 0906   AST 17 12/21/2017  0906   ALT 19 12/21/2017 0906   BILITOT <0.2 (L) 12/21/2017 0906      All questions were answered. The patient knows to call the clinic with any problems, questions or concerns. No barriers to learning was detected.  I spent 15 minutes counseling the patient face to face. The total time spent in the appointment was 20 minutes and more than 50% was on counseling and review of test results  Heath Lark, MD 12/22/2017 8:18 AM

## 2017-12-22 NOTE — Assessment & Plan Note (Signed)
She has mild pancytopenia due to treatment She is not symptomatic Observe only 

## 2017-12-24 ENCOUNTER — Ambulatory Visit: Payer: BLUE CROSS/BLUE SHIELD | Admitting: Radiation Oncology

## 2017-12-25 ENCOUNTER — Inpatient Hospital Stay: Payer: BLUE CROSS/BLUE SHIELD | Attending: Gynecologic Oncology

## 2017-12-25 ENCOUNTER — Telehealth: Payer: Self-pay | Admitting: *Deleted

## 2017-12-25 VITALS — BP 139/67 | HR 102 | Temp 98.0°F | Resp 18 | Wt 254.5 lb

## 2017-12-25 DIAGNOSIS — E876 Hypokalemia: Secondary | ICD-10-CM | POA: Insufficient documentation

## 2017-12-25 DIAGNOSIS — C541 Malignant neoplasm of endometrium: Secondary | ICD-10-CM

## 2017-12-25 DIAGNOSIS — D61810 Antineoplastic chemotherapy induced pancytopenia: Secondary | ICD-10-CM | POA: Insufficient documentation

## 2017-12-25 DIAGNOSIS — Z5111 Encounter for antineoplastic chemotherapy: Secondary | ICD-10-CM | POA: Diagnosis present

## 2017-12-25 MED ORDER — FAMOTIDINE IN NACL 20-0.9 MG/50ML-% IV SOLN
20.0000 mg | Freq: Once | INTRAVENOUS | Status: AC
  Administered 2017-12-25: 20 mg via INTRAVENOUS

## 2017-12-25 MED ORDER — HEPARIN SOD (PORK) LOCK FLUSH 100 UNIT/ML IV SOLN
500.0000 [IU] | Freq: Once | INTRAVENOUS | Status: AC | PRN
  Administered 2017-12-25: 500 [IU]
  Filled 2017-12-25: qty 5

## 2017-12-25 MED ORDER — SODIUM CHLORIDE 0.9 % IV SOLN
Freq: Once | INTRAVENOUS | Status: AC
  Administered 2017-12-25: 08:00:00 via INTRAVENOUS
  Filled 2017-12-25: qty 250

## 2017-12-25 MED ORDER — SODIUM CHLORIDE 0.9 % IV SOLN
140.0000 mg/m2 | Freq: Once | INTRAVENOUS | Status: AC
  Administered 2017-12-25: 324 mg via INTRAVENOUS
  Filled 2017-12-25: qty 54

## 2017-12-25 MED ORDER — PALONOSETRON HCL INJECTION 0.25 MG/5ML
0.2500 mg | Freq: Once | INTRAVENOUS | Status: AC
  Administered 2017-12-25: 0.25 mg via INTRAVENOUS

## 2017-12-25 MED ORDER — FAMOTIDINE IN NACL 20-0.9 MG/50ML-% IV SOLN
INTRAVENOUS | Status: AC
  Filled 2017-12-25: qty 50

## 2017-12-25 MED ORDER — DIPHENHYDRAMINE HCL 50 MG/ML IJ SOLN
INTRAMUSCULAR | Status: AC
  Filled 2017-12-25: qty 1

## 2017-12-25 MED ORDER — PALONOSETRON HCL INJECTION 0.25 MG/5ML
INTRAVENOUS | Status: AC
  Filled 2017-12-25: qty 5

## 2017-12-25 MED ORDER — SODIUM CHLORIDE 0.9 % IV SOLN
Freq: Once | INTRAVENOUS | Status: AC
  Administered 2017-12-25: 09:00:00 via INTRAVENOUS
  Filled 2017-12-25: qty 5

## 2017-12-25 MED ORDER — SODIUM CHLORIDE 0.9% FLUSH
10.0000 mL | INTRAVENOUS | Status: DC | PRN
  Administered 2017-12-25: 10 mL
  Filled 2017-12-25: qty 10

## 2017-12-25 MED ORDER — DIPHENHYDRAMINE HCL 50 MG/ML IJ SOLN
50.0000 mg | Freq: Once | INTRAMUSCULAR | Status: AC
  Administered 2017-12-25: 50 mg via INTRAVENOUS

## 2017-12-25 MED ORDER — SODIUM CHLORIDE 0.9 % IV SOLN
750.0000 mg | Freq: Once | INTRAVENOUS | Status: AC
  Administered 2017-12-25: 750 mg via INTRAVENOUS
  Filled 2017-12-25: qty 75

## 2017-12-25 NOTE — Telephone Encounter (Signed)
Called patient to remind of HDR Tx. For 12-26-17 @ 9:30 am, lvm for a return call

## 2017-12-25 NOTE — Patient Instructions (Signed)
Cancer Center Discharge Instructions for Patients Receiving Chemotherapy  Today you received the following chemotherapy agents: Paclitaxel (Taxol) and Carboplatin (Paraplatin)  To help prevent nausea and vomiting after your treatment, we encourage you to take your nausea medication as directed. Received Aloxi during treatment today-->Take Compazine (not Zofran) for the next 3 days as needed.    If you develop nausea and vomiting that is not controlled by your nausea medication, call the clinic.   BELOW ARE SYMPTOMS THAT SHOULD BE REPORTED IMMEDIATELY:  *FEVER GREATER THAN 100.5 F  *CHILLS WITH OR WITHOUT FEVER  NAUSEA AND VOMITING THAT IS NOT CONTROLLED WITH YOUR NAUSEA MEDICATION  *UNUSUAL SHORTNESS OF BREATH  *UNUSUAL BRUISING OR BLEEDING  TENDERNESS IN MOUTH AND THROAT WITH OR WITHOUT PRESENCE OF ULCERS  *URINARY PROBLEMS  *BOWEL PROBLEMS  UNUSUAL RASH Items with * indicate a potential emergency and should be followed up as soon as possible.  Feel free to call the clinic should you have any questions or concerns. The clinic phone number is [phone].  Please show the CHEMO ALERT CARD at check-in to the Emergency Department and triage nurse.   

## 2017-12-26 ENCOUNTER — Ambulatory Visit
Admission: RE | Admit: 2017-12-26 | Discharge: 2017-12-26 | Disposition: A | Discharge status: Home / Self Care | Payer: BLUE CROSS/BLUE SHIELD | Source: Ambulatory Visit | Attending: Radiation Oncology | Admitting: Radiation Oncology

## 2017-12-26 DIAGNOSIS — C541 Malignant neoplasm of endometrium: Secondary | ICD-10-CM | POA: Diagnosis present

## 2017-12-26 DIAGNOSIS — Z510 Encounter for antineoplastic radiation therapy: Secondary | ICD-10-CM | POA: Diagnosis not present

## 2017-12-26 NOTE — Progress Notes (Signed)
  Radiation Oncology         [phone] ________________________________  Name: Morgan Vincent MRN: [id number]  Date: 12/26/2017  DOB: Apr 27, 1955  CC: [name], MD  Everitt Amber, MD  HDR BRACHYTHERAPY NOTE  DIAGNOSIS: Stage IIIC1 (pT1a, pN1a, cM0) invasive endometrioid adenocarcinoma, grade 2, with LVSI   Simple treatment device note: Patient had construction of her custom vaginal cylinder. She will be treated with a 3 cm diameter segmented cylinder. This conforms to her anatomy without undue discomfort.  Vaginal brachytherapy procedure node: The patient was brought to the [place] suite. Identity was confirmed. All relevant records and images related to the planned course of therapy were reviewed. The patient freely provided informed written consent to proceed with treatment after reviewing the details related to the planned course of therapy. The consent form was witnessed and verified by the simulation staff. Then, the patient was set-up in a stable reproducible supine position for radiation therapy. Pelvic exam revealed the vaginal cuff to be intact. The patient's custom vaginal cylinder was placed in the proximal vagina. This was affixed to the CT/MR stabilization plate to prevent slippage. Patient tolerated the placement well.  Verification simulation note:  A fiducial marker was placed within the vaginal cylinder. An AP and lateral film was then obtained through the pelvis area. This documented accurate position of the vaginal cylinder for treatment.  HDR BRACHYTHERAPY TREATMENT  The remote afterloading device was affixed to the vaginal cylinder by catheter. Patient then proceeded to undergo her third high-dose-rate treatment directed at the proximal vagina. The patient was prescribed a dose of 6 gray to be delivered to the mucosal surface. Treatment length was 3.5 cm. Patient was treated with 1 channel using 8 dwell positions. Treatment time was 318.50 seconds. Iridium 192 was the  high-dose-rate source for treatment. The patient tolerated the treatment well. After completion of her therapy, a radiation survey was performed documenting return of the iridium source into the GammaMed safe.   PLAN: The patient will return next week for her fourth high-dose-rate treatment. ________________________________  Blair Promise, PhD, MD   This document serves as a record of services personally performed by Gery Pray, MD. It was created on his behalf by Wilburn Mylar, a trained medical scribe. The creation of this record is based on the scribe's personal observations and the provider's statements to them. This document has been checked and approved by the attending provider.

## 2017-12-28 ENCOUNTER — Telehealth: Payer: Self-pay | Admitting: *Deleted

## 2017-12-28 NOTE — Telephone Encounter (Signed)
CALLED PATIENT TO REMIND OF HDR TX. FOR 12-31-17 @ 9 AM, SPOKE WITH PATIENT AND SHE IS AWARE OF THIS [place].

## 2017-12-31 ENCOUNTER — Ambulatory Visit
Admission: RE | Admit: 2017-12-31 | Discharge: 2017-12-31 | Disposition: A | Discharge status: Home / Self Care | Payer: BLUE CROSS/BLUE SHIELD | Source: Ambulatory Visit | Attending: Radiation Oncology | Admitting: Radiation Oncology

## 2017-12-31 ENCOUNTER — Ambulatory Visit: Payer: BLUE CROSS/BLUE SHIELD | Admitting: Radiation Oncology

## 2017-12-31 DIAGNOSIS — C541 Malignant neoplasm of endometrium: Secondary | ICD-10-CM

## 2017-12-31 NOTE — Progress Notes (Signed)
  Radiation Oncology         [phone] ________________________________  Name: SUNDAE MANERS MRN: [id number]  Date: 12/31/2017  DOB: 1955/11/19  CC: [name], MD  Everitt Amber, MD  HDR BRACHYTHERAPY NOTE  DIAGNOSIS: Stage IIIC1 (pT1a, pN1a, cM0) invasive endometrioid adenocarcinoma, grade 2, with LVSI   Simple treatment device note: Patient had construction of her custom vaginal cylinder. She will be treated with a 3 cm diameter segmented cylinder. This conforms to her anatomy without undue discomfort.  Vaginal brachytherapy procedure node: The patient was brought to the [place] suite. Identity was confirmed. All relevant records and images related to the planned course of therapy were reviewed. The patient freely provided informed written consent to proceed with treatment after reviewing the details related to the planned course of therapy. The consent form was witnessed and verified by the simulation staff. Then, the patient was set-up in a stable reproducible supine position for radiation therapy. Pelvic exam revealed the vaginal cuff to be intact. The patient's custom vaginal cylinder was placed in the proximal vagina. This was affixed to the CT/MR stabilization plate to prevent slippage. Patient tolerated the placement well.  Verification simulation note:  A fiducial marker was placed within the vaginal cylinder. An AP and lateral film was then obtained through the pelvis area. This documented accurate position of the vaginal cylinder for treatment.  HDR BRACHYTHERAPY TREATMENT  The remote afterloading device was affixed to the vaginal cylinder by catheter. Patient then proceeded to undergo her fourth high-dose-rate treatment directed at the proximal vagina. The patient was prescribed a dose of 6 gray to be delivered to the mucosal surface. Treatment length was 3.5 cm. Patient was treated with 1 channel using 8 dwell positions. Treatment time was 333.70 seconds. Iridium 192 was the  high-dose-rate source for treatment. The patient tolerated the treatment well. After completion of her therapy, a radiation survey was performed documenting return of the iridium source into the GammaMed safe.   PLAN: The patient will return next week for her fifth high-dose-rate treatment. ________________________________  Blair Promise, PhD, MD   This document serves as a record of services personally performed by Gery Pray, MD. It was created on his behalf by [facility], a trained medical scribe. The creation of this record is based on the scribe's personal observations and the provider's statements to them. This document has been checked and approved by the attending provider.

## 2018-01-07 ENCOUNTER — Ambulatory Visit: Payer: BLUE CROSS/BLUE SHIELD | Admitting: Radiation Oncology

## 2018-01-10 ENCOUNTER — Ambulatory Visit
Admission: RE | Admit: 2018-01-10 | Discharge: 2018-01-10 | Disposition: A | Discharge status: Home / Self Care | Payer: BLUE CROSS/BLUE SHIELD | Source: Ambulatory Visit | Attending: Radiation Oncology | Admitting: Radiation Oncology

## 2018-01-10 DIAGNOSIS — C541 Malignant neoplasm of endometrium: Secondary | ICD-10-CM

## 2018-01-10 NOTE — Progress Notes (Signed)
  Radiation Oncology         [phone] ________________________________  Name: Morgan Vincent MRN: [id number]  Date: 01/10/2018  DOB: [birth year]  CC: [name], MD  Everitt Amber, MD  HDR BRACHYTHERAPY NOTE  DIAGNOSIS: Stage IIIC1 (pT1a, pN1a, cM0) invasive endometrioid adenocarcinoma, grade 2, with LVSI   Simple treatment device note: Patient had construction of her custom vaginal cylinder. She will be treated with a 3 cm diameter segmented cylinder. This conforms to her anatomy without undue discomfort.  Vaginal brachytherapy procedure node: The patient was brought to the [place] suite. Identity was confirmed. All relevant records and images related to the planned course of therapy were reviewed. The patient freely provided informed written consent to proceed with treatment after reviewing the details related to the planned course of therapy. The consent form was witnessed and verified by the simulation staff. Then, the patient was set-up in a stable reproducible supine position for radiation therapy. Pelvic exam revealed the vaginal cuff to be intact. The patient's custom vaginal cylinder was placed in the proximal vagina. This was affixed to the CT/MR stabilization plate to prevent slippage. Patient tolerated the placement well.  Verification simulation note:  A fiducial marker was placed within the vaginal cylinder. An AP and lateral film was then obtained through the pelvis area. This documented accurate position of the vaginal cylinder for treatment.  HDR BRACHYTHERAPY TREATMENT  The remote afterloading device was affixed to the vaginal cylinder by catheter. Patient then proceeded to undergo her fifth high-dose-rate treatment directed at the proximal vagina. The patient was prescribed a dose of 6 gray to be delivered to the mucosal surface. Treatment length was 3.5 cm. Patient was treated with 1 channel using 8 dwell positions. Treatment time was 366.60 seconds. Total [place]ie seconds  was 1710.19. Iridium 192 was the high-dose-rate source for treatment. The patient tolerated the treatment well. After completion of her therapy, a radiation survey was performed documenting return of the iridium source into the GammaMed safe.   PLAN: The patient has completed brachytherapy treatment. She will return for follow up in 1 month. She will continue with adjuvant chemotherapy. ________________________________  Blair Promise, PhD, MD   This document serves as a record of services personally performed by Gery Pray, MD. It was created on his behalf by Wilburn Mylar, a trained medical scribe. The creation of this record is based on the scribe's personal observations and the provider's statements to them. This document has been checked and approved by the attending provider.

## 2018-01-11 ENCOUNTER — Inpatient Hospital Stay: Payer: BLUE CROSS/BLUE SHIELD

## 2018-01-11 DIAGNOSIS — Z5111 Encounter for antineoplastic chemotherapy: Secondary | ICD-10-CM | POA: Diagnosis not present

## 2018-01-11 DIAGNOSIS — C541 Malignant neoplasm of endometrium: Secondary | ICD-10-CM

## 2018-01-11 LAB — CBC WITH DIFFERENTIAL (CANCER CENTER ONLY)
Basophils Absolute: 0 10*3/uL (ref 0.0–0.1)
Basophils Relative: 1 %
Eosinophils Absolute: 0 10*3/uL (ref 0.0–0.5)
Eosinophils Relative: 1 %
HEMATOCRIT: 32 % — AB (ref 34.8–46.6)
HEMOGLOBIN: 10.8 g/dL — AB (ref 11.6–15.9)
LYMPHS PCT: 32 %
Lymphs Abs: 1.1 10*3/uL (ref 0.9–3.3)
MCH: 27.6 pg (ref 25.1–34.0)
MCHC: 33.8 g/dL (ref 31.5–36.0)
MCV: 81.6 fL (ref 79.5–101.0)
Monocytes Absolute: 0.5 10*3/uL (ref 0.1–0.9)
Monocytes Relative: 13 %
NEUTROS PCT: 53 %
Neutro Abs: 1.9 10*3/uL (ref 1.5–6.5)
Platelet Count: 102 10*3/uL — ABNORMAL LOW (ref 145–400)
RBC: 3.92 MIL/uL (ref 3.70–5.45)
RDW: 20.5 % — ABNORMAL HIGH (ref 11.2–14.5)
WBC Count: 3.6 10*3/uL — ABNORMAL LOW (ref 3.9–10.3)

## 2018-01-11 LAB — CMP (CANCER CENTER ONLY)
ALK PHOS: 96 U/L (ref 38–126)
ALT: 17 U/L (ref 0–44)
ANION GAP: 12 (ref 5–15)
AST: 16 U/L (ref 15–41)
Albumin: 3.8 g/dL (ref 3.5–5.0)
BUN: 11 mg/dL (ref 8–23)
CO2: 31 mmol/L (ref 22–32)
Calcium: 10 mg/dL (ref 8.9–10.3)
Chloride: 100 mmol/L (ref 98–111)
Creatinine: 0.73 mg/dL (ref 0.44–1.00)
GFR, Estimated: >60 mL/min (ref >60)
GLUCOSE: 106 mg/dL — AB (ref 70–99)
POTASSIUM: 2.9 mmol/L — AB (ref 3.5–5.1)
Sodium: 143 mmol/L (ref 135–145)
Total Bilirubin: 0.2 mg/dL — ABNORMAL LOW (ref 0.3–1.2)
Total Protein: 7.8 g/dL (ref 6.5–8.1)

## 2018-01-14 ENCOUNTER — Encounter: Payer: Self-pay | Admitting: Radiation Oncology

## 2018-01-14 ENCOUNTER — Ambulatory Visit: Payer: BLUE CROSS/BLUE SHIELD | Admitting: Radiation Oncology

## 2018-01-14 ENCOUNTER — Inpatient Hospital Stay (HOSPITAL_BASED_OUTPATIENT_CLINIC_OR_DEPARTMENT_OTHER): Payer: BLUE CROSS/BLUE SHIELD | Admitting: Hematology and Oncology

## 2018-01-14 ENCOUNTER — Telehealth: Payer: Self-pay | Admitting: Hematology and Oncology

## 2018-01-14 ENCOUNTER — Inpatient Hospital Stay: Payer: BLUE CROSS/BLUE SHIELD

## 2018-01-14 VITALS — BP 129/99 | HR 112 | Temp 98.1°F | Resp 18 | Ht 66.0 in | Wt 248.7 lb

## 2018-01-14 VITALS — HR 88

## 2018-01-14 DIAGNOSIS — E876 Hypokalemia: Secondary | ICD-10-CM

## 2018-01-14 DIAGNOSIS — D61810 Antineoplastic chemotherapy induced pancytopenia: Secondary | ICD-10-CM | POA: Diagnosis not present

## 2018-01-14 DIAGNOSIS — D61818 Other pancytopenia: Secondary | ICD-10-CM

## 2018-01-14 DIAGNOSIS — C541 Malignant neoplasm of endometrium: Secondary | ICD-10-CM

## 2018-01-14 DIAGNOSIS — Z5111 Encounter for antineoplastic chemotherapy: Secondary | ICD-10-CM | POA: Diagnosis not present

## 2018-01-14 DIAGNOSIS — I10 Essential (primary) hypertension: Secondary | ICD-10-CM

## 2018-01-14 MED ORDER — FAMOTIDINE IN NACL 20-0.9 MG/50ML-% IV SOLN
INTRAVENOUS | Status: AC
  Filled 2018-01-14: qty 50

## 2018-01-14 MED ORDER — SODIUM CHLORIDE 0.9 % IV SOLN
750.0000 mg | Freq: Once | INTRAVENOUS | Status: AC
  Administered 2018-01-14: 750 mg via INTRAVENOUS
  Filled 2018-01-14: qty 75

## 2018-01-14 MED ORDER — SODIUM CHLORIDE 0.9 % IV SOLN
140.0000 mg/m2 | Freq: Once | INTRAVENOUS | Status: AC
  Administered 2018-01-14: 324 mg via INTRAVENOUS
  Filled 2018-01-14: qty 54

## 2018-01-14 MED ORDER — SODIUM CHLORIDE 0.9 % IV SOLN
Freq: Once | INTRAVENOUS | Status: AC
  Administered 2018-01-14: 12:00:00 via INTRAVENOUS
  Filled 2018-01-14: qty 5

## 2018-01-14 MED ORDER — FAMOTIDINE IN NACL 20-0.9 MG/50ML-% IV SOLN
20.0000 mg | Freq: Once | INTRAVENOUS | Status: AC
  Administered 2018-01-14: 20 mg via INTRAVENOUS

## 2018-01-14 MED ORDER — HEPARIN SOD (PORK) LOCK FLUSH 100 UNIT/ML IV SOLN
500.0000 [IU] | Freq: Once | INTRAVENOUS | Status: AC | PRN
  Administered 2018-01-14: 500 [IU]
  Filled 2018-01-14: qty 5

## 2018-01-14 MED ORDER — DIPHENHYDRAMINE HCL 50 MG/ML IJ SOLN
INTRAMUSCULAR | Status: AC
  Filled 2018-01-14: qty 1

## 2018-01-14 MED ORDER — SODIUM CHLORIDE 0.9 % IV SOLN
Freq: Once | INTRAVENOUS | Status: AC
  Administered 2018-01-14: 11:00:00 via INTRAVENOUS
  Filled 2018-01-14: qty 250

## 2018-01-14 MED ORDER — PALONOSETRON HCL INJECTION 0.25 MG/5ML
INTRAVENOUS | Status: AC
  Filled 2018-01-14: qty 5

## 2018-01-14 MED ORDER — DIPHENHYDRAMINE HCL 50 MG/ML IJ SOLN
50.0000 mg | Freq: Once | INTRAMUSCULAR | Status: AC
  Administered 2018-01-14: 50 mg via INTRAVENOUS

## 2018-01-14 MED ORDER — SODIUM CHLORIDE 0.9% FLUSH
10.0000 mL | INTRAVENOUS | Status: DC | PRN
  Administered 2018-01-14: 10 mL
  Filled 2018-01-14: qty 10

## 2018-01-14 MED ORDER — AMLODIPINE BESYLATE 5 MG PO TABS: 5.0000 mg | ORAL_TABLET | Freq: Every day | ORAL | 1 refills | Status: DC

## 2018-01-14 MED ORDER — PALONOSETRON HCL INJECTION 0.25 MG/5ML
0.2500 mg | Freq: Once | INTRAVENOUS | Status: AC
  Administered 2018-01-14: 0.25 mg via INTRAVENOUS

## 2018-01-14 NOTE — Patient Instructions (Signed)
Cancer Center Discharge Instructions for Patients Receiving Chemotherapy  Today you received the following chemotherapy agents: Paclitaxel (Taxol) and Carboplatin (Paraplatin)  To help prevent nausea and vomiting after your treatment, we encourage you to take your nausea medication as directed. Received Aloxi during treatment today-->Take Compazine (not Zofran) for the next 3 days as needed.    If you develop nausea and vomiting that is not controlled by your nausea medication, call the clinic.   BELOW ARE SYMPTOMS THAT SHOULD BE REPORTED IMMEDIATELY:  *FEVER GREATER THAN 100.5 F  *CHILLS WITH OR WITHOUT FEVER  NAUSEA AND VOMITING THAT IS NOT CONTROLLED WITH YOUR NAUSEA MEDICATION  *UNUSUAL SHORTNESS OF BREATH  *UNUSUAL BRUISING OR BLEEDING  TENDERNESS IN MOUTH AND THROAT WITH OR WITHOUT PRESENCE OF ULCERS  *URINARY PROBLEMS  *BOWEL PROBLEMS  UNUSUAL RASH Items with * indicate a potential emergency and should be followed up as soon as possible.  Feel free to call the clinic should you have any questions or concerns. The clinic phone number is [phone].  Please show the CHEMO ALERT CARD at check-in to the Emergency Department and triage nurse.   

## 2018-01-14 NOTE — Progress Notes (Signed)
  Radiation Oncology         [phone]) [phone] ________________________________  Name: Morgan Vincent MRN: [id number]  Date: 01/14/2018  DOB: 1955-10-10  End of Treatment Note  Diagnosis:   Stage IIIC1 (pT1a, pN1a, cM0) invasive endometrioid adenocarcinoma, grade 2, with LVSI     Indication for treatment:  Curative       Radiation treatment dates:   12/12/17 - 01/10/18  Site/dose:   Vaginal Cuff/ 30 Gy in 5 fractions of 6 Gy  Beams/energy:  Brachytherapy/ HDR Ir-192 Vaginal, 3.0 cm cylinder, 3.5 cm treatment length  Narrative: The patient tolerated radiation treatment relatively well.   Plan: The patient has completed radiation treatment. The patient will return to radiation oncology clinic for routine followup in one month. I advised them to call or return sooner if they have any questions or concerns related to their recovery or treatment.  -----------------------------------  Blair Promise, PhD, MD  This document serves as a record of services personally performed by Gery Pray, MD. It was created on his behalf by Wilburn Mylar, a trained medical scribe. The creation of this record is based on the scribe's personal observations and the provider's statements to them. This document has been checked and approved by the attending provider.

## 2018-01-14 NOTE — Telephone Encounter (Signed)
Gave pt avs and calendar  °

## 2018-01-15 ENCOUNTER — Encounter: Payer: Self-pay | Admitting: Hematology and Oncology

## 2018-01-15 NOTE — Assessment & Plan Note (Signed)
She has severe hypokalemia likely due to diuretic therapy and poor oral potassium intake I recommend reducing her diuretic by half and substitute with low-dose amlodipine We discussed potassium rich diet.

## 2018-01-15 NOTE — Assessment & Plan Note (Addendum)
She tolerated treatment well except for pancytopenia I plan to continue treatment

## 2018-01-15 NOTE — Assessment & Plan Note (Signed)
She has mild pancytopenia due to treatment She is not symptomatic Observe only 

## 2018-01-15 NOTE — Progress Notes (Signed)
[place] OFFICE PROGRESS NOTE  Patient Care Team: [name], MD as PCP - General (Geriatric Medicine) Ena Dawley, MD as Consulting Physician (Obstetrics and Gynecology)  ASSESSMENT & PLAN:  Endometrial cancer [facility]) She tolerated treatment well except for pancytopenia I plan to continue treatment  Pancytopenia, acquired [facility]) She has mild pancytopenia due to treatment She is not symptomatic.  Observe only  Hypokalemia, inadequate intake She has severe hypokalemia likely due to diuretic therapy and poor oral potassium intake I recommend reducing her diuretic by half and substitute with low-dose amlodipine We discussed potassium rich diet.   No orders of the defined types were placed in this encounter.   INTERVAL HISTORY: Please see below for problem oriented charting. She returns for chemotherapy She denies recent infection, fever or chills No recent nausea or constipation No peripheral neuropathy Overall, she tolerated treatment well. The patient denies any recent signs or symptoms of bleeding such as spontaneous epistaxis, hematuria or hematochezia.   SUMMARY OF ONCOLOGIC HISTORY: Oncology History   MSI stable Endometrioid     Endometrial cancer ([place])   09/19/2017 Initial Diagnosis    A CT scan of the abdomen and pelvis was performed which revealed mild inflammation involving distal small bowel loops including the terminal ileum with no obstruction, diverticulosis without definite diverticulitis.  No abscess.  The uterus was markedly abnormal with large heterogeneous soft tissues within the endometrial cavity.  There are multiple calcified fibroids identified.  She was presumed to have a diagnosis of gastroenteritis and was discharged home with recommendation to follow-up with her OB/GYN provider for work-up of her abnormal uterine findings.  She saw Dr. Raphael Gibney on May 31st, 2019 and a Pap smear was obtained which revealed atypical glandular  cells, HPV negative.  An endometrial biopsy was performed which revealed minute fragments of malignant neoplasm, favor high-grade carcinoma.  There is small amount of of tissue was too small for further evaluation with immunohistochemistry     10/15/2017 Pathology Results    1. Lymph node, sentinel, biopsy, right obturator AMENDED DIAGNOSIS: - METASTATIC ADENOCARCINOMA TO LYMPH NODE (1/1). (CONFIRMED WITH IMMUNOHISTOCHEMICAL STAIN FOR PANKERATIN). 2. Lymph nodes, regional resection, left pelvic - SIX LYMPH NODES, NEGATIVE FOR CARCINOMA (0/6). 3. Lymph nodes, regional resection, left para aortic - THREE LYMPH NODES, NEGATIVE FOR CARCINOMA (0/3). 4. 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 - CARCINOMA INVADES FOR A DEPTH OF 1.0 CM WHERE MYOMETRIAL THICKNESS IS 3.0 CM (LESS THAN 50%). - TUMOR DOES NOT INVOLVE CERVIX OF SEROSAL SURFACE. - LYMPHOVASCULAR INVASION IS PRESENT. - BENIGN LEIOMYOMATA. - BENIGN BILATERAL OVARIES AND FALLOPIAN TUBES. - SEE ONCOLOGY TABLE. Microscopic Comment 4. UTERUS, CARCINOMA OR CARCINOSARCOMA Procedure: Total hysterectomy with bilateral salpingo-oophorectomy. Histologic type: Endometrioid adenocarcinoma. Histologic Grade: FIGO grade II. Myometrial invasion: Depth of invasion: 10 mm Myometrial thickness: 30 mm Uterine Serosa Involvement: Not identified. Cervical stromal involvement: Not identified. Extent of involvement of other organs: N/A. Lymphovascular invasion: Present. Regional Lymph Nodes: Examined: 1 Sentinel 9 Non-sentinel 10 Total Lymph nodes with metastasis: 1 Isolated tumor cells: 0 Micrometastasis: 0 Macrometastasis: 1 Extranodal extension: not present. Tumor block for ancillary studies: 73F and 4G. MMR / MSI testing: Pending. Pathologic Stage Classification (pTNM, AJCC 8th edition): pT1a, pN1a.  (v4.1.0.0) Diagnosis Note 4. Immunohistochemical stains for MMR-related proteins and molecular studies for microsatellite instability are pending and will be reported in an addendum. (NDK:gt, 10/17/17)  10/15/2017 Surgery    Surgeon: Donaciano Eva    Operation: Robotic-assisted laparoscopic total hysterectomy >250gm with bilateral salpingoophorectomy, SLN biopsy, pelvic and para-aortic lymphadenectomy  Operative Findings:  : 14cm bulky fibroid uterus with adhesions to appendix. Slightly enlarged right obturator SLN. Unilateral mapping to right side.  No gross extrauterine disease.       10/29/2017 Cancer Staging    Staging form: Corpus Uteri - Carcinoma and Carcinosarcoma, AJCC 8th Edition - Pathologic: Stage IIIC1 (pT1a, pN1a, cM0) - Signed by Heath Lark, MD on 10/29/2017    11/06/2017 Imaging    Expected postop changes from TAH-BSO. No complication identified. No evidence of residual or metastatic carcinoma.    11/08/2017 Procedure    Status post right IJ port catheter placement. Catheter ready for use.    11/12/2017 -  Chemotherapy    The patient had carboplatin and taxol     REVIEW OF SYSTEMS:   Constitutional: Denies fevers, chills or abnormal weight loss Eyes: Denies blurriness of vision Ears, nose, mouth, throat, and face: Denies mucositis or sore throat Respiratory: Denies cough, dyspnea or wheezes Cardiovascular: Denies palpitation, chest discomfort or lower extremity swelling Gastrointestinal:  Denies nausea, heartburn or change in bowel habits Skin: Denies abnormal skin rashes Lymphatics: Denies new lymphadenopathy or easy bruising Neurological:Denies numbness, tingling or new weaknesses Behavioral/Psych: Mood is stable, no new changes  All other systems were reviewed with the patient and are negative.  I have reviewed the past medical history, past surgical history, social history and family history with the patient and they are unchanged from previous  note.  ALLERGIES:  has No Known Allergies.  MEDICATIONS:  Current Outpatient Medications  Medication Sig Dispense Refill  . acetaminophen (TYLENOL) 325 MG tablet Take 650 mg by mouth every 6 (six) hours as needed for mild pain.    Marland Kitchen albuterol (PROVENTIL HFA;VENTOLIN HFA) 108 (90 Base) MCG/ACT inhaler Inhale 2 puffs into the lungs every 6 (six) hours as needed for wheezing or shortness of breath.    Marland Kitchen amLODipine (NORVASC) 5 MG tablet Take 1 tablet (5 mg total) by mouth daily. 30 tablet 1  . Ascorbic Acid (VITAMIN C) 1000 MG tablet Take 1,000 mg by mouth daily.    Marland Kitchen aspirin 81 MG tablet Take 81 mg by mouth daily.    Marland Kitchen CALCIUM PO Take 1 tablet by mouth daily.    Marland Kitchen dexamethasone (DECADRON) 4 MG tablet Take 3 tabs at the night before and 3 tabs the morning of chemotherapy, every 3 weeks, by mouth 36 tablet 0  . hydrochlorothiazide (HYDRODIURIL) 50 MG tablet Take 50 mg by mouth daily    . ibuprofen (ADVIL,MOTRIN) 800 MG tablet ibuprofen 800 mg tablet    . lidocaine-prilocaine (EMLA) cream Apply to affected area once 30 g 3  . loratadine (CLARITIN) 10 MG tablet loratadine 10 mg tablet  TAKE 1 TABLET (10 MG TOTAL) BY MOUTH DAILY.    Marland Kitchen ondansetron (ZOFRAN) 8 MG tablet Take 1 tablet (8 mg total) by mouth every 8 (eight) hours as needed for refractory nausea / vomiting. Start on day 3 after chemo. 30 tablet 1  . Potassium 99 MG TABS Take 99 mg by mouth daily.    Marland Kitchen PROBIOTIC PRODUCT PO Take 1 capsule by mouth daily.     . prochlorperazine (COMPAZINE) 10 MG tablet Take 1 tablet (10 mg total) by mouth every 6 (six) hours as needed (Nausea or vomiting). 30 tablet 1  . vitamin E 1000 UNIT capsule Take 1,000 Units  by mouth daily.    Marland Kitchen zinc gluconate 50 MG tablet Take 50 mg by mouth daily.     No current facility-administered medications for this visit.     PHYSICAL EXAMINATION: ECOG PERFORMANCE STATUS: 1 - Symptomatic but completely ambulatory  Vitals:   01/14/18 1000  BP: (!) 129/99  Pulse: (!)  112  Resp: 18  Temp: 98.1 F (36.7 C)  SpO2: 100%   Filed Weights   01/14/18 1000  Weight: 248 lb 11.2 oz (112.8 kg)    GENERAL:alert, no distress and comfortable SKIN: skin color, texture, turgor are normal, no rashes or significant lesions EYES: normal, Conjunctiva are pink and non-injected, sclera clear OROPHARYNX:no exudate, no erythema and lips, buccal mucosa, and tongue normal  NECK: supple, thyroid normal size, non-tender, without nodularity LYMPH:  no palpable lymphadenopathy in the cervical, axillary or inguinal LUNGS: clear to auscultation and percussion with normal breathing effort HEART: regular rate & rhythm and no murmurs and no lower extremity edema ABDOMEN:abdomen soft, non-tender and normal bowel sounds Musculoskeletal:no cyanosis of digits and no clubbing  NEURO: alert & oriented x 3 with fluent speech, no focal motor/sensory deficits  LABORATORY DATA:  I have reviewed the data as listed    Component Value Date/Time   NA 143 01/11/2018 1110   K 2.9 (LL) 01/11/2018 1110   CL 100 01/11/2018 1110   CO2 31 01/11/2018 1110   GLUCOSE 106 (H) 01/11/2018 1110   BUN 11 01/11/2018 1110   CREATININE 0.73 01/11/2018 1110   CALCIUM 10.0 01/11/2018 1110   PROT 7.8 01/11/2018 1110   ALBUMIN 3.8 01/11/2018 1110   AST 16 01/11/2018 1110   ALT 17 01/11/2018 1110   ALKPHOS 96 01/11/2018 1110   BILITOT 0.2 (L) 01/11/2018 1110   GFRNONAA >60 01/11/2018 1110   GFRAA >60 01/11/2018 1110    No results found for: SPEP, UPEP  Lab Results  Component Value Date   WBC 3.6 (L) 01/11/2018   NEUTROABS 1.9 01/11/2018   HGB 10.8 (L) 01/11/2018   HCT 32.0 (L) 01/11/2018   MCV 81.6 01/11/2018   PLT 102 (L) 01/11/2018      Chemistry      Component Value Date/Time   NA 143 01/11/2018 1110   K 2.9 (LL) 01/11/2018 1110   CL 100 01/11/2018 1110   CO2 31 01/11/2018 1110   BUN 11 01/11/2018 1110   CREATININE 0.73 01/11/2018 1110      Component Value Date/Time   CALCIUM 10.0  01/11/2018 1110   ALKPHOS 96 01/11/2018 1110   AST 16 01/11/2018 1110   ALT 17 01/11/2018 1110   BILITOT 0.2 (L) 01/11/2018 1110       All questions were answered. The patient knows to call the clinic with any problems, questions or concerns. No barriers to learning was detected.  I spent 15 minutes counseling the patient face to face. The total time spent in the appointment was 20 minutes and more than 50% was on counseling and review of test results  Heath Lark, MD 01/15/2018 8:29 AM

## 2018-01-21 ENCOUNTER — Ambulatory Visit: Payer: BLUE CROSS/BLUE SHIELD | Admitting: Radiation Oncology

## 2018-02-04 ENCOUNTER — Encounter: Payer: Self-pay | Admitting: Hematology and Oncology

## 2018-02-04 ENCOUNTER — Inpatient Hospital Stay: Payer: BLUE CROSS/BLUE SHIELD

## 2018-02-04 ENCOUNTER — Inpatient Hospital Stay: Payer: BLUE CROSS/BLUE SHIELD | Attending: Gynecologic Oncology

## 2018-02-04 ENCOUNTER — Other Ambulatory Visit: Payer: Self-pay

## 2018-02-04 ENCOUNTER — Inpatient Hospital Stay: Payer: BLUE CROSS/BLUE SHIELD | Admitting: Hematology and Oncology

## 2018-02-04 VITALS — HR 115

## 2018-02-04 DIAGNOSIS — R739 Hyperglycemia, unspecified: Secondary | ICD-10-CM

## 2018-02-04 DIAGNOSIS — C541 Malignant neoplasm of endometrium: Secondary | ICD-10-CM | POA: Insufficient documentation

## 2018-02-04 DIAGNOSIS — E876 Hypokalemia: Secondary | ICD-10-CM | POA: Insufficient documentation

## 2018-02-04 DIAGNOSIS — G620 Drug-induced polyneuropathy: Secondary | ICD-10-CM

## 2018-02-04 DIAGNOSIS — D61810 Antineoplastic chemotherapy induced pancytopenia: Secondary | ICD-10-CM

## 2018-02-04 DIAGNOSIS — Z5111 Encounter for antineoplastic chemotherapy: Secondary | ICD-10-CM | POA: Insufficient documentation

## 2018-02-04 DIAGNOSIS — D61818 Other pancytopenia: Secondary | ICD-10-CM

## 2018-02-04 DIAGNOSIS — T451X5A Adverse effect of antineoplastic and immunosuppressive drugs, initial encounter: Secondary | ICD-10-CM

## 2018-02-04 LAB — CBC WITH DIFFERENTIAL (CANCER CENTER ONLY)
Abs Immature Granulocytes: 0.01 10*3/uL (ref 0.00–0.07)
BASOS ABS: 0 10*3/uL (ref 0.0–0.1)
Basophils Relative: 0 %
EOS ABS: 0 10*3/uL (ref 0.0–0.5)
Eosinophils Relative: 0 %
HEMATOCRIT: 28.6 % — AB (ref 36.0–46.0)
Hemoglobin: 9.8 g/dL — ABNORMAL LOW (ref 12.0–15.0)
IMMATURE GRANULOCYTES: 0 %
LYMPHS ABS: 0.6 10*3/uL — AB (ref 0.7–4.0)
LYMPHS PCT: 14 %
MCH: 28.5 pg (ref 26.0–34.0)
MCHC: 34.3 g/dL (ref 30.0–36.0)
MCV: 83.1 fL (ref 80.0–100.0)
Monocytes Absolute: 0.1 10*3/uL (ref 0.1–1.0)
Monocytes Relative: 3 %
NEUTROS PCT: 83 %
NRBC: 0 % (ref 0.0–0.2)
Neutro Abs: 3.9 10*3/uL (ref 1.7–7.7)
Platelet Count: 108 10*3/uL — ABNORMAL LOW (ref 150–400)
RBC: 3.44 MIL/uL — AB (ref 3.87–5.11)
RDW: 23 % — AB (ref 11.5–15.5)
WBC Count: 4.7 10*3/uL (ref 4.0–10.5)

## 2018-02-04 LAB — COMPREHENSIVE METABOLIC PANEL
ALBUMIN: 3.9 g/dL (ref 3.5–5.0)
ALT: 20 U/L (ref 0–44)
ANION GAP: 14 (ref 5–15)
AST: 26 U/L (ref 15–41)
Alkaline Phosphatase: 75 U/L (ref 38–126)
BILIRUBIN TOTAL: 0.4 mg/dL (ref 0.3–1.2)
BUN: 14 mg/dL (ref 8–23)
CALCIUM: 9.7 mg/dL (ref 8.9–10.3)
CO2: 26 mmol/L (ref 22–32)
CREATININE: 0.75 mg/dL (ref 0.44–1.00)
Chloride: 101 mmol/L (ref 98–111)
GFR calc non Af Amer: >60 mL/min (ref >60)
GLUCOSE: 164 mg/dL — AB (ref 70–99)
Potassium: 3 mmol/L — ABNORMAL LOW (ref 3.5–5.1)
SODIUM: 141 mmol/L (ref 135–145)
TOTAL PROTEIN: 7.9 g/dL (ref 6.5–8.1)

## 2018-02-04 MED ORDER — SODIUM CHLORIDE 0.9 % IV SOLN
Freq: Once | INTRAVENOUS | Status: AC
  Administered 2018-02-04: 11:00:00 via INTRAVENOUS
  Filled 2018-02-04: qty 250

## 2018-02-04 MED ORDER — POTASSIUM CHLORIDE CRYS ER 20 MEQ PO TBCR: 20.0000 meq | EXTENDED_RELEASE_TABLET | Freq: Every day | ORAL | 0 refills | Status: DC

## 2018-02-04 MED ORDER — DIPHENHYDRAMINE HCL 50 MG/ML IJ SOLN
50.0000 mg | Freq: Once | INTRAMUSCULAR | Status: AC
  Administered 2018-02-04: 50 mg via INTRAVENOUS

## 2018-02-04 MED ORDER — SODIUM CHLORIDE 0.9% FLUSH
10.0000 mL | Freq: Once | INTRAVENOUS | Status: AC
  Administered 2018-02-04: 10 mL
  Filled 2018-02-04: qty 10

## 2018-02-04 MED ORDER — SODIUM CHLORIDE 0.9 % IV SOLN
140.0000 mg/m2 | Freq: Once | INTRAVENOUS | Status: AC
  Administered 2018-02-04: 324 mg via INTRAVENOUS
  Filled 2018-02-04: qty 54

## 2018-02-04 MED ORDER — FAMOTIDINE IN NACL 20-0.9 MG/50ML-% IV SOLN
INTRAVENOUS | Status: AC
  Filled 2018-02-04: qty 50

## 2018-02-04 MED ORDER — PALONOSETRON HCL INJECTION 0.25 MG/5ML
INTRAVENOUS | Status: AC
  Filled 2018-02-04: qty 5

## 2018-02-04 MED ORDER — FAMOTIDINE IN NACL 20-0.9 MG/50ML-% IV SOLN
20.0000 mg | Freq: Once | INTRAVENOUS | Status: AC
  Administered 2018-02-04: 20 mg via INTRAVENOUS

## 2018-02-04 MED ORDER — SODIUM CHLORIDE 0.9 % IV SOLN
Freq: Once | INTRAVENOUS | Status: AC
  Administered 2018-02-04: 12:00:00 via INTRAVENOUS
  Filled 2018-02-04: qty 5

## 2018-02-04 MED ORDER — SODIUM CHLORIDE 0.9% FLUSH
10.0000 mL | INTRAVENOUS | Status: DC | PRN
  Administered 2018-02-04: 10 mL
  Filled 2018-02-04: qty 10

## 2018-02-04 MED ORDER — PALONOSETRON HCL INJECTION 0.25 MG/5ML
0.2500 mg | Freq: Once | INTRAVENOUS | Status: AC
  Administered 2018-02-04: 0.25 mg via INTRAVENOUS

## 2018-02-04 MED ORDER — SODIUM CHLORIDE 0.9 % IV SOLN
750.0000 mg | Freq: Once | INTRAVENOUS | Status: AC
  Administered 2018-02-04: 750 mg via INTRAVENOUS
  Filled 2018-02-04: qty 75

## 2018-02-04 MED ORDER — DIPHENHYDRAMINE HCL 50 MG/ML IJ SOLN
INTRAMUSCULAR | Status: AC
  Filled 2018-02-04: qty 1

## 2018-02-04 MED ORDER — HEPARIN SOD (PORK) LOCK FLUSH 100 UNIT/ML IV SOLN
500.0000 [IU] | Freq: Once | INTRAVENOUS | Status: AC | PRN
  Administered 2018-02-04: 500 [IU]
  Filled 2018-02-04: qty 5

## 2018-02-04 NOTE — Patient Instructions (Addendum)
[facility] Discharge Instructions for Patients Receiving Chemotherapy  Today you received the following chemotherapy agents Taxol, Carboplatin  To help prevent nausea and vomiting after your treatment, we encourage you to take your nausea medication as directed  If you develop nausea and vomiting that is not controlled by your nausea medication, call the clinic.   BELOW ARE SYMPTOMS THAT SHOULD BE REPORTED IMMEDIATELY:  *FEVER GREATER THAN 100.5 F  *CHILLS WITH OR WITHOUT FEVER  NAUSEA AND VOMITING THAT IS NOT CONTROLLED WITH YOUR NAUSEA MEDICATION  *UNUSUAL SHORTNESS OF BREATH  *UNUSUAL BRUISING OR BLEEDING  TENDERNESS IN MOUTH AND THROAT WITH OR WITHOUT PRESENCE OF ULCERS  *URINARY PROBLEMS  *BOWEL PROBLEMS  UNUSUAL RASH Items with * indicate a potential emergency and should be followed up as soon as possible.  Feel free to call the clinic should you have any questions or concerns. The clinic phone number is [phone].  Please show the CHEMO ALERT CARD at check-in to the Emergency Department and triage nurse.   

## 2018-02-04 NOTE — Progress Notes (Signed)
[place] OFFICE PROGRESS NOTE  Patient Care Team: [name], MD as PCP - General (Geriatric Medicine) Ena Dawley, MD as Consulting Physician (Obstetrics and Gynecology)  ASSESSMENT & PLAN:  Endometrial cancer [facility]) She tolerated treatment well except for pancytopenia and hyperglycemia I plan to continue treatment without dose adjustment  Pancytopenia, acquired [facility]) She has mild pancytopenia due to treatment She is not symptomatic.  Observe only  Hypokalemia, inadequate intake This is related to her diuretic therapy The patient declined changing her diuretics She is interested to go on potassium replacement therapy and we will prescribe potassium supplement  Peripheral neuropathy due to chemotherapy [employer]) she has mild peripheral neuropathy, likely related to side effects of treatment. It is only mild, not bothering the patient. I will observe for now If it gets worse in the future, I will consider modifying the dose of the treatment    No orders of the defined types were placed in this encounter.   INTERVAL HISTORY: Please see below for problem oriented charting. She returns for cycle 5 of treatment She is doing well She complained of very minimal neuropathy affecting her toes No recent infection, fever or chills No nausea She did not change her blood pressure medications lately and attempting to taking potassium rich diet  SUMMARY OF ONCOLOGIC HISTORY: Oncology History   MSI stable Endometrioid     Endometrial cancer ([place])   09/19/2017 Initial Diagnosis    A CT scan of the abdomen and pelvis was performed which revealed mild inflammation involving distal small bowel loops including the terminal ileum with no obstruction, diverticulosis without definite diverticulitis.  No abscess.  The uterus was markedly abnormal with large heterogeneous soft tissues within the endometrial cavity.  There are multiple calcified fibroids identified.  She  was presumed to have a diagnosis of gastroenteritis and was discharged home with recommendation to follow-up with her OB/GYN provider for work-up of her abnormal uterine findings.  She saw Dr. Raphael Gibney on May 31st, 2019 and a Pap smear was obtained which revealed atypical glandular cells, HPV negative.  An endometrial biopsy was performed which revealed minute fragments of malignant neoplasm, favor high-grade carcinoma.  There is small amount of of tissue was too small for further evaluation with immunohistochemistry     10/15/2017 Pathology Results    1. Lymph node, sentinel, biopsy, right obturator AMENDED DIAGNOSIS: - METASTATIC ADENOCARCINOMA TO LYMPH NODE (1/1). (CONFIRMED WITH IMMUNOHISTOCHEMICAL STAIN FOR PANKERATIN). 2. Lymph nodes, regional resection, left pelvic - SIX LYMPH NODES, NEGATIVE FOR CARCINOMA (0/6). 3. Lymph nodes, regional resection, left para aortic - THREE LYMPH NODES, NEGATIVE FOR CARCINOMA (0/3). 4. 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 - CARCINOMA INVADES FOR A DEPTH OF 1.0 CM WHERE MYOMETRIAL THICKNESS IS 3.0 CM (LESS THAN 50%). - TUMOR DOES NOT INVOLVE CERVIX OF SEROSAL SURFACE. - LYMPHOVASCULAR INVASION IS PRESENT. - BENIGN LEIOMYOMATA. - BENIGN BILATERAL OVARIES AND FALLOPIAN TUBES. - SEE ONCOLOGY TABLE. Microscopic Comment 4. UTERUS, CARCINOMA OR CARCINOSARCOMA Procedure: Total hysterectomy with bilateral salpingo-oophorectomy. Histologic type: Endometrioid adenocarcinoma. Histologic Grade: FIGO grade II. Myometrial invasion: Depth of invasion: 10 mm Myometrial thickness: 30 mm Uterine Serosa Involvement: Not identified. Cervical stromal involvement: Not identified. Extent of involvement of other organs: N/A. Lymphovascular invasion: Present. Regional Lymph Nodes: Examined: 1 Sentinel 9 Non-sentinel 10  Total Lymph nodes with metastasis: 1 Isolated tumor cells: 0 Micrometastasis: 0 Macrometastasis: 1 Extranodal extension: not  present. Tumor block for ancillary studies: 49F and 4G. MMR / MSI testing: Pending. Pathologic Stage Classification (pTNM, AJCC 8th edition): pT1a, pN1a. (v4.1.0.0) Diagnosis Note 4. Immunohistochemical stains for MMR-related proteins and molecular studies for microsatellite instability are pending and will be reported in an addendum. (NDK:gt, 10/17/17)     10/15/2017 Surgery    Surgeon: Donaciano Eva    Operation: Robotic-assisted laparoscopic total hysterectomy >250gm with bilateral salpingoophorectomy, SLN biopsy, pelvic and para-aortic lymphadenectomy  Operative Findings:  : 14cm bulky fibroid uterus with adhesions to appendix. Slightly enlarged right obturator SLN. Unilateral mapping to right side.  No gross extrauterine disease.       10/29/2017 Cancer Staging    Staging form: Corpus Uteri - Carcinoma and Carcinosarcoma, AJCC 8th Edition - Pathologic: Stage IIIC1 (pT1a, pN1a, cM0) - Signed by Heath Lark, MD on 10/29/2017    11/06/2017 Imaging    Expected postop changes from TAH-BSO. No complication identified. No evidence of residual or metastatic carcinoma.    11/08/2017 Procedure    Status post right IJ port catheter placement. Catheter ready for use.    11/12/2017 -  Chemotherapy    The patient had carboplatin and taxol     REVIEW OF SYSTEMS:   Constitutional: Denies fevers, chills or abnormal weight loss Eyes: Denies blurriness of vision Ears, nose, mouth, throat, and face: Denies mucositis or sore throat Respiratory: Denies cough, dyspnea or wheezes Cardiovascular: Denies palpitation, chest discomfort or lower extremity swelling Gastrointestinal:  Denies nausea, heartburn or change in bowel habits Skin: Denies abnormal skin rashes Lymphatics: Denies new lymphadenopathy or easy bruising Neurological:Denies numbness, tingling or new  weaknesses Behavioral/Psych: Mood is stable, no new changes  All other systems were reviewed with the patient and are negative.  I have reviewed the past medical history, past surgical history, social history and family history with the patient and they are unchanged from previous note.  ALLERGIES:  has No Known Allergies.  MEDICATIONS:  Current Outpatient Medications  Medication Sig Dispense Refill  . acetaminophen (TYLENOL) 325 MG tablet Take 650 mg by mouth every 6 (six) hours as needed for mild pain.    Marland Kitchen albuterol (PROVENTIL HFA;VENTOLIN HFA) 108 (90 Base) MCG/ACT inhaler Inhale 2 puffs into the lungs every 6 (six) hours as needed for wheezing or shortness of breath.    . Ascorbic Acid (VITAMIN C) 1000 MG tablet Take 1,000 mg by mouth daily.    Marland Kitchen aspirin 81 MG tablet Take 81 mg by mouth daily.    Marland Kitchen CALCIUM PO Take 1 tablet by mouth daily.    Marland Kitchen dexamethasone (DECADRON) 4 MG tablet Take 3 tabs at the night before and 3 tabs the morning of chemotherapy, every 3 weeks, by mouth 36 tablet 0  . hydrochlorothiazide (HYDRODIURIL) 50 MG tablet Take 50 mg by mouth daily    . ibuprofen (ADVIL,MOTRIN) 800 MG tablet ibuprofen 800 mg tablet    . lidocaine-prilocaine (EMLA) cream Apply to affected area once 30 g 3  . loratadine (CLARITIN) 10 MG tablet loratadine 10 mg tablet  TAKE 1 TABLET (10 MG TOTAL) BY MOUTH DAILY.    Marland Kitchen ondansetron (ZOFRAN) 8 MG tablet Take 1 tablet (8 mg total) by mouth every 8 (eight) hours as needed for refractory nausea / vomiting. Start on day 3 after chemo. 30 tablet 1  . Potassium 99 MG TABS Take 99 mg by mouth daily.    . potassium chloride SA (K-DUR,KLOR-CON) 20 MEQ tablet Take 1 tablet (20 mEq total) by mouth  daily. 30 tablet 0  . PROBIOTIC PRODUCT PO Take 1 capsule by mouth daily.     . prochlorperazine (COMPAZINE) 10 MG tablet Take 1 tablet (10 mg total) by mouth every 6 (six) hours as needed (Nausea or vomiting). 30 tablet 1  . vitamin E 1000 UNIT capsule Take 1,000  Units by mouth daily.    Marland Kitchen zinc gluconate 50 MG tablet Take 50 mg by mouth daily.     No current facility-administered medications for this visit.    Facility-Administered Medications Ordered in Other Visits  Medication Dose Route Frequency Provider Last Rate Last Dose  . CARBOplatin (PARAPLATIN) 750 mg in sodium chloride 0.9 % 250 mL chemo infusion  750 mg Intravenous Once Alvy Bimler, Chrysa Rampy, MD      . heparin lock flush 100 unit/mL  500 Units Intracatheter Once PRN Alvy Bimler, Javad Salva, MD      . PACLitaxel (TAXOL) 324 mg in sodium chloride 0.9 % 500 mL chemo infusion (> '80mg'$ /m2)  140 mg/m2 (Treatment Plan Recorded) Intravenous Once Heath Lark, MD 185 mL/hr at 02/04/18 1249 324 mg at 02/04/18 1249  . sodium chloride flush (NS) 0.9 % injection 10 mL  10 mL Intracatheter PRN Alvy Bimler, Aisling Emigh, MD        PHYSICAL EXAMINATION: ECOG PERFORMANCE STATUS: 1 - Symptomatic but completely ambulatory  Vitals:   02/04/18 1008  BP: 121/71  Pulse: (!) 112  Resp: 18  Temp: 98.4 F (36.9 C)  SpO2: 99%   Filed Weights   02/04/18 1008  Weight: 250 lb 9.6 oz (113.7 kg)    GENERAL:alert, no distress and comfortable SKIN: skin color, texture, turgor are normal, no rashes or significant lesions EYES: normal, Conjunctiva are pink and non-injected, sclera clear OROPHARYNX:no exudate, no erythema and lips, buccal mucosa, and tongue normal  NECK: supple, thyroid normal size, non-tender, without nodularity LYMPH:  no palpable lymphadenopathy in the cervical, axillary or inguinal LUNGS: clear to auscultation and percussion with normal breathing effort HEART: regular rate & rhythm and no murmurs and no lower extremity edema ABDOMEN:abdomen soft, non-tender and normal bowel sounds Musculoskeletal:no cyanosis of digits and no clubbing  NEURO: alert & oriented x 3 with fluent speech, no focal motor/sensory deficits  LABORATORY DATA:  I have reviewed the data as listed    Component Value Date/Time   NA 141 02/04/2018 0954    K 3.0 (L) 02/04/2018 0954   CL 101 02/04/2018 0954   CO2 26 02/04/2018 0954   GLUCOSE 164 (H) 02/04/2018 0954   BUN 14 02/04/2018 0954   CREATININE 0.75 02/04/2018 0954   CREATININE 0.73 01/11/2018 1110   CALCIUM 9.7 02/04/2018 0954   PROT 7.9 02/04/2018 0954   ALBUMIN 3.9 02/04/2018 0954   AST 26 02/04/2018 0954   AST 16 01/11/2018 1110   ALT 20 02/04/2018 0954   ALT 17 01/11/2018 1110   ALKPHOS 75 02/04/2018 0954   BILITOT 0.4 02/04/2018 0954   BILITOT 0.2 (L) 01/11/2018 1110   GFRNONAA >60 02/04/2018 0954   GFRNONAA >60 01/11/2018 1110   GFRAA >60 02/04/2018 0954   GFRAA >60 01/11/2018 1110    No results found for: SPEP, UPEP  Lab Results  Component Value Date   WBC 4.7 02/04/2018   NEUTROABS 3.9 02/04/2018   HGB 9.8 (L) 02/04/2018   HCT 28.6 (L) 02/04/2018   MCV 83.1 02/04/2018   PLT 108 (L) 02/04/2018      Chemistry      Component Value Date/Time   NA 141 02/04/2018 0954  K 3.0 (L) 02/04/2018 0954   CL 101 02/04/2018 0954   CO2 26 02/04/2018 0954   BUN 14 02/04/2018 0954   CREATININE 0.75 02/04/2018 0954   CREATININE 0.73 01/11/2018 1110      Component Value Date/Time   CALCIUM 9.7 02/04/2018 0954   ALKPHOS 75 02/04/2018 0954   AST 26 02/04/2018 0954   AST 16 01/11/2018 1110   ALT 20 02/04/2018 0954   ALT 17 01/11/2018 1110   BILITOT 0.4 02/04/2018 0954   BILITOT 0.2 (L) 01/11/2018 1110      All questions were answered. The patient knows to call the clinic with any problems, questions or concerns. No barriers to learning was detected.  I spent 15 minutes counseling the patient face to face. The total time spent in the appointment was 20 minutes and more than 50% was on counseling and review of test results  Heath Lark, MD 02/04/2018 2:38 PM

## 2018-02-04 NOTE — Assessment & Plan Note (Signed)
She has mild pancytopenia due to treatment She is not symptomatic Observe only 

## 2018-02-04 NOTE — Assessment & Plan Note (Signed)
This is related to her diuretic therapy The patient declined changing her diuretics She is interested to go on potassium replacement therapy and we will prescribe potassium supplement

## 2018-02-04 NOTE — Assessment & Plan Note (Signed)
She tolerated treatment well except for pancytopenia and hyperglycemia I plan to continue treatment without dose adjustment

## 2018-02-04 NOTE — Assessment & Plan Note (Signed)
she has mild peripheral neuropathy, likely related to side effects of treatment. It is only mild, not bothering the patient. I will observe for now If it gets worse in the future, I will consider modifying the dose of the treatment  

## 2018-02-04 NOTE — Progress Notes (Signed)
Okay to treat today with HR of 115, per Dr. Alvy Bimler. Rx for Mayer sent to  Pharmacy.

## 2018-02-04 NOTE — Progress Notes (Unsigned)
Panic K and alkp reported to and read back by Darliss Ridgel at 11.40am. ku

## 2018-02-05 ENCOUNTER — Other Ambulatory Visit: Payer: Self-pay | Admitting: Hematology and Oncology

## 2018-02-11 ENCOUNTER — Encounter: Payer: Self-pay | Admitting: Radiation Oncology

## 2018-02-11 ENCOUNTER — Other Ambulatory Visit: Payer: Self-pay

## 2018-02-11 ENCOUNTER — Ambulatory Visit
Admission: RE | Admit: 2018-02-11 | Discharge: 2018-02-11 | Disposition: A | Discharge status: Home / Self Care | Payer: BLUE CROSS/BLUE SHIELD | Source: Ambulatory Visit | Attending: Radiation Oncology | Admitting: Radiation Oncology

## 2018-02-11 VITALS — BP 123/82 | HR 118 | Temp 98.2°F | Resp 18 | Ht 63.5 in | Wt 245.1 lb

## 2018-02-11 DIAGNOSIS — Z7982 Long term (current) use of aspirin: Secondary | ICD-10-CM | POA: Insufficient documentation

## 2018-02-11 DIAGNOSIS — Z923 Personal history of irradiation: Secondary | ICD-10-CM | POA: Diagnosis not present

## 2018-02-11 DIAGNOSIS — Z79899 Other long term (current) drug therapy: Secondary | ICD-10-CM | POA: Diagnosis not present

## 2018-02-11 DIAGNOSIS — C541 Malignant neoplasm of endometrium: Secondary | ICD-10-CM | POA: Insufficient documentation

## 2018-02-11 DIAGNOSIS — Z9221 Personal history of antineoplastic chemotherapy: Secondary | ICD-10-CM | POA: Diagnosis not present

## 2018-02-11 NOTE — Progress Notes (Signed)
Pt presents today for one month f/u with Dr. Sondra Come. Pt is accompanied by goddaughter. Pt denies c/o pain. Pt reports moderate fatigue. Pt is receiving chemotherapy every 3 weeks with next infusion in 2 weeks. Pt denies dysuria/hematuria. Pt denies vaginal bleeding/discharge. Pt denies rectal bleeding, diarrhea. Pt endorses constipation moderately relieved by Miralax. Pt denies abdominal bloating. Pt endorses mild nausea that is relieved by antiemetics and attributes this to chemotherapy. Vaginal dilator teaching given with good understanding.   BP 123/82 (BP Location: Right Arm, Patient Position: Sitting)   Pulse (!) 118   Temp 98.2 F (36.8 C) (Oral)   Resp 18   Ht 5' 3.5" (1.613 m)   Wt 245 lb 2 oz (111.2 kg)   SpO2 100%   BMI 42.74 kg/m   Wt Readings from Last 3 Encounters:  02/11/18 245 lb 2 oz (111.2 kg)  02/04/18 250 lb 9.6 oz (113.7 kg)  01/14/18 248 lb 11.2 oz (112.8 kg)   Loma Sousa, RN BSN

## 2018-02-11 NOTE — Progress Notes (Signed)
Radiation Oncology         [phone] ________________________________  Name: Morgan Vincent MRN: [id number]  Date: 02/11/2018  DOB: 1955-10-10  Follow-Up Visit Note  CC: [name], MD  Everitt Amber, MD    ICD-10-CM   1. Endometrial cancer (HCC) C54.1     Diagnosis: Stage IIIC1 (pT1a, pN1a, cM0) invasive endometrioid adenocarcinoma, grade 2, with LVSI    Interval Since Last Radiation:  1 month 2 days   Radiation treatment dates:   12/12/17 - 01/10/18  Site/dose:   Vaginal Cuff/ 30 Gy in 5 fractions of 6 Gy  Narrative:  The patient returns today for routine follow-up. She is doing well overall. She is accompanied by a family member. She notes that the chemo is "wearing" on her and she has one more cycle left. She denies any side effects following radiation and she tolerated radiation well.  Pt is scheduled for a repeat mammogram on 04/15/2018.         She reports associated fatigue (low energy). She denies any issues with her port-a-cath, however, notes mild issues if she lays on her right side. she denies vaginal bleeding, vaginal discharge, SOB, breast swelling, and any other symptoms.                  ALLERGIES:  has No Known Allergies.  Meds: Current Outpatient Medications  Medication Sig Dispense Refill  . acetaminophen (TYLENOL) 325 MG tablet Take 650 mg by mouth every 6 (six) hours as needed for mild pain.    Marland Kitchen albuterol (PROVENTIL HFA;VENTOLIN HFA) 108 (90 Base) MCG/ACT inhaler Inhale 2 puffs into the lungs every 6 (six) hours as needed for wheezing or shortness of breath.    . Ascorbic Acid (VITAMIN C) 1000 MG tablet Take 1,000 mg by mouth daily.    Marland Kitchen CALCIUM PO Take 1 tablet by mouth daily.    Marland Kitchen dexamethasone (DECADRON) 4 MG tablet Take 3 tabs at the night before and 3 tabs the morning of chemotherapy, every 3 weeks, by mouth 36 tablet 0  . hydrochlorothiazide (HYDRODIURIL) 50 MG tablet Take 50 mg by mouth daily    . ibuprofen (ADVIL,MOTRIN) 800 MG tablet  ibuprofen 800 mg tablet    . lidocaine-prilocaine (EMLA) cream Apply to affected area once 30 g 3  . loratadine (CLARITIN) 10 MG tablet loratadine 10 mg tablet  TAKE 1 TABLET (10 MG TOTAL) BY MOUTH DAILY.    Marland Kitchen ondansetron (ZOFRAN) 8 MG tablet Take 1 tablet (8 mg total) by mouth every 8 (eight) hours as needed for refractory nausea / vomiting. Start on day 3 after chemo. 30 tablet 1  . potassium chloride SA (K-DUR,KLOR-CON) 20 MEQ tablet Take 1 tablet (20 mEq total) by mouth daily. 30 tablet 0  . PROBIOTIC PRODUCT PO Take 1 capsule by mouth daily.     . prochlorperazine (COMPAZINE) 10 MG tablet Take 1 tablet (10 mg total) by mouth every 6 (six) hours as needed (Nausea or vomiting). 30 tablet 1  . vitamin E 1000 UNIT capsule Take 1,000 Units by mouth daily.    Marland Kitchen zinc gluconate 50 MG tablet Take 50 mg by mouth daily.    Marland Kitchen aspirin 81 MG tablet Take 81 mg by mouth daily.    . Potassium 99 MG TABS Take 99 mg by mouth daily.     No current facility-administered medications for this encounter.     Physical Findings: The patient is in no acute distress. Patient is alert and oriented.  height is 5' 3.5" (1.613 m) and weight is 245 lb 2 oz (111.2 kg). Her oral temperature is 98.2 F (36.8 C). Her blood pressure is 123/82 and her pulse is 118 (abnormal). Her respiration is 18 and oxygen saturation is 100%.   Lungs are clear to auscultation bilaterally. Heart has regular rate and rhythm. No palpable cervical, supraclavicular, or axillary adenopathy. Abdomen soft, non-tender, normal bowel sounds. Pelvic exam deferred today due to recent completion of treatment.   Lab Findings: Lab Results  Component Value Date   WBC 4.7 02/04/2018   HGB 9.8 (L) 02/04/2018   HCT 28.6 (L) 02/04/2018   MCV 83.1 02/04/2018   PLT 108 (L) 02/04/2018    Radiographic Findings: No results found.  Impression:  Clinically stable. Patient has one more cycle of chemotherapy to complete her adjuvant treatment.   Plan:  Routine follow up in Radiation Oncology in 5 months. Patient will see Dr. Denman George in 2 months. Patient was given a vaginal dilator and instructions on its use.   ____________________________________   Blair Promise, PhD, MD    This document serves as a record of services personally performed by Gery Pray, MD. It was created on his behalf by [facility], a trained medical scribe. The creation of this record is based on the scribe's personal observations and the provider's statements to them. This document has been checked and approved by the attending provider.

## 2018-02-21 ENCOUNTER — Telehealth: Payer: Self-pay | Admitting: Hematology and Oncology

## 2018-02-21 NOTE — Telephone Encounter (Signed)
NG out 10/30 thru 11/29. Moved 11/4 f/u to [facility]. Spoke with patient.

## 2018-02-25 ENCOUNTER — Encounter: Payer: Self-pay | Admitting: Oncology

## 2018-02-25 ENCOUNTER — Telehealth: Payer: Self-pay | Admitting: Oncology

## 2018-02-25 ENCOUNTER — Inpatient Hospital Stay: Payer: BLUE CROSS/BLUE SHIELD

## 2018-02-25 ENCOUNTER — Inpatient Hospital Stay: Payer: BLUE CROSS/BLUE SHIELD | Admitting: Oncology

## 2018-02-25 ENCOUNTER — Inpatient Hospital Stay: Payer: BLUE CROSS/BLUE SHIELD | Attending: Gynecologic Oncology

## 2018-02-25 VITALS — BP 136/80 | HR 114 | Temp 97.9°F | Resp 18 | Ht 63.5 in | Wt 247.0 lb

## 2018-02-25 DIAGNOSIS — I10 Essential (primary) hypertension: Secondary | ICD-10-CM

## 2018-02-25 DIAGNOSIS — G620 Drug-induced polyneuropathy: Secondary | ICD-10-CM | POA: Insufficient documentation

## 2018-02-25 DIAGNOSIS — C541 Malignant neoplasm of endometrium: Secondary | ICD-10-CM | POA: Diagnosis not present

## 2018-02-25 DIAGNOSIS — Z5111 Encounter for antineoplastic chemotherapy: Secondary | ICD-10-CM | POA: Insufficient documentation

## 2018-02-25 DIAGNOSIS — E876 Hypokalemia: Secondary | ICD-10-CM | POA: Insufficient documentation

## 2018-02-25 DIAGNOSIS — R5383 Other fatigue: Secondary | ICD-10-CM | POA: Insufficient documentation

## 2018-02-25 DIAGNOSIS — D61810 Antineoplastic chemotherapy induced pancytopenia: Secondary | ICD-10-CM

## 2018-02-25 DIAGNOSIS — T451X5A Adverse effect of antineoplastic and immunosuppressive drugs, initial encounter: Secondary | ICD-10-CM

## 2018-02-25 DIAGNOSIS — D61818 Other pancytopenia: Secondary | ICD-10-CM

## 2018-02-25 LAB — CMP (CANCER CENTER ONLY)
ALT: 22 U/L (ref 0–44)
AST: 17 U/L (ref 15–41)
Albumin: 3.9 g/dL (ref 3.5–5.0)
Alkaline Phosphatase: 92 U/L (ref 38–126)
Anion gap: 13 (ref 5–15)
BUN: 14 mg/dL (ref 8–23)
CO2: 27 mmol/L (ref 22–32)
Calcium: 10.1 mg/dL (ref 8.9–10.3)
Chloride: 102 mmol/L (ref 98–111)
Creatinine: 0.77 mg/dL (ref 0.44–1.00)
GFR, Est AFR Am: >60 mL/min
GFR, Estimated: >60 mL/min
Glucose, Bld: 110 mg/dL — ABNORMAL HIGH (ref 70–99)
Potassium: 3.1 mmol/L — ABNORMAL LOW (ref 3.5–5.1)
Sodium: 142 mmol/L (ref 135–145)
Total Bilirubin: 0.3 mg/dL (ref 0.3–1.2)
Total Protein: 8.1 g/dL (ref 6.5–8.1)

## 2018-02-25 LAB — CBC WITH DIFFERENTIAL (CANCER CENTER ONLY)
Abs Immature Granulocytes: 0.03 10*3/uL (ref 0.00–0.07)
BASOS ABS: 0 10*3/uL (ref 0.0–0.1)
Basophils Relative: 0 %
EOS PCT: 0 %
Eosinophils Absolute: 0 10*3/uL (ref 0.0–0.5)
HEMATOCRIT: 26.6 % — AB (ref 36.0–46.0)
HEMOGLOBIN: 8.9 g/dL — AB (ref 12.0–15.0)
IMMATURE GRANULOCYTES: 1 %
LYMPHS PCT: 14 %
Lymphs Abs: 0.7 10*3/uL (ref 0.7–4.0)
MCH: 29.8 pg (ref 26.0–34.0)
MCHC: 33.5 g/dL (ref 30.0–36.0)
MCV: 89 fL (ref 80.0–100.0)
Monocytes Absolute: 0.3 10*3/uL (ref 0.1–1.0)
Monocytes Relative: 5 %
NRBC: 0.4 % — AB (ref 0.0–0.2)
Neutro Abs: 4.4 10*3/uL (ref 1.7–7.7)
Neutrophils Relative %: 80 %
Platelet Count: 121 10*3/uL — ABNORMAL LOW (ref 150–400)
RBC: 2.99 MIL/uL — ABNORMAL LOW (ref 3.87–5.11)
RDW: 23.2 % — ABNORMAL HIGH (ref 11.5–15.5)
WBC Count: 5.4 10*3/uL (ref 4.0–10.5)

## 2018-02-25 MED ORDER — DIPHENHYDRAMINE HCL 50 MG/ML IJ SOLN
INTRAMUSCULAR | Status: AC
  Filled 2018-02-25: qty 1

## 2018-02-25 MED ORDER — HEPARIN SOD (PORK) LOCK FLUSH 100 UNIT/ML IV SOLN
500.0000 [IU] | Freq: Once | INTRAVENOUS | Status: AC | PRN
  Administered 2018-02-25: 500 [IU]
  Filled 2018-02-25: qty 5

## 2018-02-25 MED ORDER — DIPHENHYDRAMINE HCL 50 MG/ML IJ SOLN
50.0000 mg | Freq: Once | INTRAMUSCULAR | Status: AC
  Administered 2018-02-25: 50 mg via INTRAVENOUS

## 2018-02-25 MED ORDER — SODIUM CHLORIDE 0.9% FLUSH
10.0000 mL | Freq: Once | INTRAVENOUS | Status: AC
  Administered 2018-02-25: 10 mL
  Filled 2018-02-25: qty 10

## 2018-02-25 MED ORDER — SODIUM CHLORIDE 0.9 % IV SOLN
20.0000 mg | Freq: Once | INTRAVENOUS | Status: AC
  Administered 2018-02-25: 20 mg via INTRAVENOUS
  Filled 2018-02-25: qty 2

## 2018-02-25 MED ORDER — FAMOTIDINE IN NACL 20-0.9 MG/50ML-% IV SOLN: 20.0000 mg | Freq: Once | INTRAVENOUS | Status: DC

## 2018-02-25 MED ORDER — PALONOSETRON HCL INJECTION 0.25 MG/5ML
0.2500 mg | Freq: Once | INTRAVENOUS | Status: AC
  Administered 2018-02-25: 0.25 mg via INTRAVENOUS

## 2018-02-25 MED ORDER — SODIUM CHLORIDE 0.9 % IV SOLN
Freq: Once | INTRAVENOUS | Status: AC
  Administered 2018-02-25: 11:00:00 via INTRAVENOUS
  Filled 2018-02-25: qty 5

## 2018-02-25 MED ORDER — SODIUM CHLORIDE 0.9% FLUSH
10.0000 mL | INTRAVENOUS | Status: DC | PRN
  Administered 2018-02-25: 10 mL
  Filled 2018-02-25: qty 10

## 2018-02-25 MED ORDER — SODIUM CHLORIDE 0.9 % IV SOLN
750.0000 mg | Freq: Once | INTRAVENOUS | Status: AC
  Administered 2018-02-25: 750 mg via INTRAVENOUS
  Filled 2018-02-25: qty 75

## 2018-02-25 MED ORDER — SODIUM CHLORIDE 0.9 % IV SOLN
Freq: Once | INTRAVENOUS | Status: AC
  Administered 2018-02-25: 10:00:00 via INTRAVENOUS
  Filled 2018-02-25: qty 250

## 2018-02-25 MED ORDER — SODIUM CHLORIDE 0.9 % IV SOLN
140.0000 mg/m2 | Freq: Once | INTRAVENOUS | Status: AC
  Administered 2018-02-25: 324 mg via INTRAVENOUS
  Filled 2018-02-25: qty 54

## 2018-02-25 MED ORDER — PALONOSETRON HCL INJECTION 0.25 MG/5ML
INTRAVENOUS | Status: AC
  Filled 2018-02-25: qty 5

## 2018-02-25 NOTE — Progress Notes (Signed)
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  No abscess.  The uterus was markedly abnormal with large heterogeneous soft tissues within the endometrial cavity.  There are multiple calcified fibroids identified.  She was presumed to have a diagnosis of gastroenteritis and was discharged home with recommendation to follow-up with her OB/GYN provider for work-up of her abnormal uterine findings.  She saw Dr. Raphael Gibney on May 31st, 2019 and a Pap smear was obtained which revealed atypical glandular cells, HPV negative.  An endometrial biopsy was performed which revealed minute fragments of malignant neoplasm, favor high-grade carcinoma.  There is small amount of of tissue was too small for further evaluation with immunohistochemistry     10/15/2017 Pathology Results    1. Lymph node, sentinel, biopsy, right obturator AMENDED DIAGNOSIS: - METASTATIC ADENOCARCINOMA TO LYMPH NODE (1/1). (CONFIRMED WITH IMMUNOHISTOCHEMICAL STAIN FOR PANKERATIN). 2. Lymph nodes, regional resection, left pelvic - SIX LYMPH NODES, NEGATIVE FOR CARCINOMA (0/6). 3. Lymph nodes, regional resection, left para aortic - THREE LYMPH NODES, NEGATIVE FOR CARCINOMA (0/3). 4. 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 - CARCINOMA INVADES FOR A DEPTH OF 1.0 CM WHERE MYOMETRIAL THICKNESS IS 3.0 CM (LESS THAN 50%). - TUMOR DOES NOT  INVOLVE CERVIX OF SEROSAL SURFACE. - LYMPHOVASCULAR INVASION IS PRESENT. - BENIGN LEIOMYOMATA. - BENIGN BILATERAL OVARIES AND FALLOPIAN TUBES. - SEE ONCOLOGY TABLE. Microscopic Comment 4. UTERUS, CARCINOMA OR CARCINOSARCOMA Procedure: Total hysterectomy with bilateral salpingo-oophorectomy. Histologic type: Endometrioid adenocarcinoma. Histologic Grade: FIGO grade II. Myometrial invasion: Depth of invasion: 10 mm Myometrial thickness: 30 mm Uterine Serosa Involvement: Not identified. Cervical stromal involvement: Not identified. Extent of involvement of other organs: N/A. Lymphovascular invasion: Present. Regional Lymph Nodes: Examined: 1 Sentinel 9 Non-sentinel 10 Total Lymph nodes with metastasis: 1 Isolated tumor cells: 0 Micrometastasis: 0 Macrometastasis: 1 Extranodal extension: not present. Tumor block for ancillary studies: 16F and 4G. MMR / MSI testing: Pending. Pathologic Stage Classification (pTNM, AJCC 8th edition): pT1a, pN1a. (v4.1.0.0) Diagnosis Note 4. Immunohistochemical stains for MMR-related proteins and molecular studies for microsatellite instability are pending and will be reported in an addendum. (NDK:gt, 10/17/17)     10/15/2017 Surgery    Surgeon: Donaciano Eva    Operation: Robotic-assisted laparoscopic total hysterectomy >250gm with bilateral salpingoophorectomy, SLN biopsy, pelvic and para-aortic lymphadenectomy  Operative Findings:  : 14cm bulky fibroid uterus with adhesions to appendix. Slightly enlarged right obturator SLN. Unilateral mapping to right side.  No gross extrauterine disease.       10/29/2017 Cancer Staging    Staging form: Corpus Uteri - Carcinoma and Carcinosarcoma, AJCC 8th Edition - Pathologic: Stage IIIC1 (pT1a, pN1a, cM0) - Signed by Heath Lark, MD on 10/29/2017    11/06/2017 Imaging    Expected postop changes from TAH-BSO. No complication identified. No evidence of residual or metastatic carcinoma.    11/08/2017  Procedure    Status post right IJ port catheter placement. Catheter ready for use.    11/12/2017 -  Chemotherapy  The patient had carboplatin and taxol     CURRENT THERAPY: Cycle 6 of adjuvant carboplatin and Taxol.  INTERVAL HISTORY: Morgan Vincent 62 y.o. female returns for routine follow-up visit accompanied by family member.  The patient reports that she is having fatigue secondary to chemotherapy.  Took her longer to recover following her last cycle of chemo.  Denies fevers and chills.  Denies chest pain, shortness of breath, cough, hemoptysis.  She has had intermittent nausea and takes Zofran which is effective.  Denies vomiting.  Denies constipation diarrhea.  Denies recent weight loss or night sweats.  Peripheral neuropathy stable.  Denies vaginal bleeding.  The patient is here for evaluation prior to cycle #6 of her chemotherapy.  MEDICAL HISTORY: Past Medical History:  Diagnosis Date  . Asthma   . Diabetes mellitus without complication ([place])   . Fibroids   . Hypertension     ALLERGIES:  has No Known Allergies.  MEDICATIONS:  Current Outpatient Medications  Medication Sig Dispense Refill  . acetaminophen (TYLENOL) 325 MG tablet Take 650 mg by mouth every 6 (six) hours as needed for mild pain.    Marland Kitchen albuterol (PROVENTIL HFA;VENTOLIN HFA) 108 (90 Base) MCG/ACT inhaler Inhale 2 puffs into the lungs every 6 (six) hours as needed for wheezing or shortness of breath.    . Ascorbic Acid (VITAMIN C) 1000 MG tablet Take 1,000 mg by mouth daily.    Marland Kitchen CALCIUM PO Take 1 tablet by mouth daily.    Marland Kitchen dexamethasone (DECADRON) 4 MG tablet Take 3 tabs at the night before and 3 tabs the morning of chemotherapy, every 3 weeks, by mouth 36 tablet 0  . hydrochlorothiazide (HYDRODIURIL) 50 MG tablet Take 50 mg by mouth daily    . ibuprofen (ADVIL,MOTRIN) 800 MG tablet ibuprofen 800 mg tablet    . lidocaine-prilocaine (EMLA) cream Apply to affected area once 30 g 3  . loratadine (CLARITIN) 10 MG  tablet loratadine 10 mg tablet  TAKE 1 TABLET (10 MG TOTAL) BY MOUTH DAILY.    Marland Kitchen ondansetron (ZOFRAN) 8 MG tablet Take 1 tablet (8 mg total) by mouth every 8 (eight) hours as needed for refractory nausea / vomiting. Start on day 3 after chemo. 30 tablet 1  . potassium chloride SA (K-DUR,KLOR-CON) 20 MEQ tablet Take 1 tablet (20 mEq total) by mouth daily. 30 tablet 0  . PROBIOTIC PRODUCT PO Take 1 capsule by mouth daily.     . vitamin E 1000 UNIT capsule Take 1,000 Units by mouth daily.    Marland Kitchen zinc gluconate 50 MG tablet Take 50 mg by mouth daily.    Marland Kitchen aspirin 81 MG tablet Take 81 mg by mouth daily.    . prochlorperazine (COMPAZINE) 10 MG tablet Take 1 tablet (10 mg total) by mouth every 6 (six) hours as needed (Nausea or vomiting). (Patient not taking: Reported on 02/25/2018) 30 tablet 1   No current facility-administered medications for this visit.    Facility-Administered Medications Ordered in Other Visits  Medication Dose Route Frequency Provider Last Rate Last Dose  . CARBOplatin (PARAPLATIN) 750 mg in sodium chloride 0.9 % 250 mL chemo infusion  750 mg Intravenous Once Alvy Bimler, Ni, MD      . famotidine (PEPCID) 20 mg in sodium chloride 0.9 % 100 mL IVPB  20 mg Intravenous Once Alvy Bimler, Ni, MD 408 mL/hr at 02/25/18 1044 20 mg at 02/25/18 1044  . fosaprepitant (EMEND) 150 mg, dexamethasone (DECADRON) 12 mg in sodium chloride 0.9 % 145  mL IVPB   Intravenous Once Alvy Bimler, Ni, MD      . heparin lock flush 100 unit/mL  500 Units Intracatheter Once PRN Alvy Bimler, Ni, MD      . PACLitaxel (TAXOL) 324 mg in sodium chloride 0.9 % 500 mL chemo infusion (> '80mg'$ /m2)  140 mg/m2 (Treatment Plan Recorded) Intravenous Once Gorsuch, Ni, MD      . sodium chloride flush (NS) 0.9 % injection 10 mL  10 mL Intracatheter PRN Heath Lark, MD        SURGICAL HISTORY:  Past Surgical History:  Procedure Laterality Date  . IR IMAGING GUIDED PORT INSERTION  11/08/2017  . LAPAROTOMY N/A 10/15/2017   Procedure: MINI  LAPAROTOMY;  Surgeon: Everitt Amber, MD;  Location: [facility];  Service: Gynecology;  Laterality: N/A;  . MYOMECTOMY    . ROBOTIC ASSISTED TOTAL HYSTERECTOMY WITH BILATERAL SALPINGO OOPHERECTOMY Bilateral 10/15/2017   Procedure: XI ROBOTIC ASSISTED TOTAL HYSTERECTOMY GREATER THAN 250 GRAMS WITH BILATERAL SALPINGO OOPHORECTOMY, SENTINAL LYMPH NODE BIOPSY , PERI AND PARA- [place] LYMPH NODE DISECTIOIN;  Surgeon: Everitt Amber, MD;  Location: [facility];  Service: Gynecology;  Laterality: Bilateral;    REVIEW OF SYSTEMS:   Review of Systems  Constitutional: Negative for appetite change, chills, fever and unexpected weight change.  Positive for fatigue. HENT:   Negative for mouth sores, nosebleeds, sore throat and trouble swallowing.   Eyes: Negative for eye problems and icterus.  Respiratory: Negative for cough, hemoptysis, shortness of breath and wheezing.   Cardiovascular: Negative for chest pain and leg swelling.  Gastrointestinal: Negative for abdominal pain, constipation, diarrhea, and vomiting.  Denies nausea today but has nausea following her chemotherapy.  Uses Zofran. Genitourinary: Negative for bladder incontinence, difficulty urinating, dysuria, frequency and hematuria.   Musculoskeletal: Negative for back pain, gait problem, neck pain and neck stiffness.  Skin: Negative for itching and rash.  Neurological: Negative for dizziness, extremity weakness, gait problem, headaches, light-headedness and seizures.  Peripheral neuropathy is stable. Hematological: Negative for adenopathy. Does not bruise/bleed easily.  Psychiatric/Behavioral: Negative for confusion, depression and sleep disturbance. The patient is not nervous/anxious.     PHYSICAL EXAMINATION:  Blood pressure 136/80, pulse (!) 114, temperature 97.9 F (36.6 C), temperature source Oral, resp. rate 18, height 5' 3.5" (1.613 m), weight 247 lb (112 kg), SpO2 100 %.  ECOG PERFORMANCE STATUS: 1 - Symptomatic but completely  ambulatory  Physical Exam  Constitutional: Oriented to person, place, and time and well-developed, well-nourished, and in no distress. No distress.  HENT:  Head: Normocephalic and atraumatic.  Mouth/Throat: Oropharynx is clear and moist. No oropharyngeal exudate.  Eyes: Conjunctivae are normal. Right eye exhibits no discharge. Left eye exhibits no discharge. No scleral icterus.  Neck: Normal range of motion. Neck supple.  Cardiovascular: Normal rate, regular rhythm, normal heart sounds and intact distal pulses.   Pulmonary/Chest: Effort normal and breath sounds normal. No respiratory distress. No wheezes. No rales.  Abdominal: Soft. Bowel sounds are normal. Exhibits no distension and no mass. There is no tenderness.  Musculoskeletal: Normal range of motion. Exhibits no edema.  Lymphadenopathy:    No cervical adenopathy.  Neurological: Alert and oriented to person, place, and time. Exhibits normal muscle tone. Gait normal. Coordination normal.  Skin: Skin is warm and dry. No rash noted. Not diaphoretic. No erythema. No pallor.  Psychiatric: Mood, memory and judgment normal.  Vitals reviewed.  LABORATORY DATA: Lab Results  Component Value Date   WBC 5.4 02/25/2018   HGB 8.9 (L) 02/25/2018  HCT 26.6 (L) 02/25/2018   MCV 89.0 02/25/2018   PLT 121 (L) 02/25/2018      Chemistry      Component Value Date/Time   NA 142 02/25/2018 0859   K 3.1 (L) 02/25/2018 0859   CL 102 02/25/2018 0859   CO2 27 02/25/2018 0859   BUN 14 02/25/2018 0859   CREATININE 0.77 02/25/2018 0859      Component Value Date/Time   CALCIUM 10.1 02/25/2018 0859   ALKPHOS 92 02/25/2018 0859   AST 17 02/25/2018 0859   ALT 22 02/25/2018 0859   BILITOT 0.3 02/25/2018 0859       RADIOGRAPHIC STUDIES:  No results found.   ASSESSMENT/PLAN:  Endometrial cancer [facility]) This is a pleasant 62 year old African-American female with endometrial cancer.  Currently on carboplatin and Taxol.  Tolerating her treatment  well with the exception of pancytopenia and hyperglycemia.  She has mild peripheral neuropathy which has not worsened.  Labs been reviewed.  Recommend for her to proceed with cycle 6 of her treatment today as scheduled.  Patient case was reviewed with Dr. Julien Nordmann and Dr. Calton Dach absence.  She will have a restaging CT scan of the abdomen and pelvis in approximately 6 weeks and follow-up with Dr. Alvy Bimler 1 to 2 days after the CT scan.  Pancytopenia, acquired [facility]) Due to chemotherapy.  She has fatigue related to her anemia.  We will continue to monitor this.  No transfusion is indicated at this time.  White blood cell count is normal.  Platelets are slightly low at 121,000.  No bleeding noted.  Hypokalemia, inadequate intake Due to diuretics.  Potassium level has improved to 3.1 today.  Continue K-Dur 20 mEq daily.  We discussed increasing potassium rich foods in her diet.  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  We will continue to observe.   Orders Placed This Encounter  Procedures  . CT ABDOMEN PELVIS W CONTRAST    Standing Status:   Future    Standing Expiration Date:   02/26/2019    Order Specific Question:   If indicated for the ordered procedure, I authorize the administration of contrast media per Radiology protocol    Answer:   Yes    Order Specific Question:   Preferred imaging location?    Answer:   [facility]    Order Specific Question:   Radiology Contrast Protocol - do NOT remove file path    Answer:   [email or web address]    Order Specific Question:   ** REASON FOR EXAM (FREE TEXT)    Answer:   endometrial cancer. Restaging.     Mikey Bussing, DNP, AGPCNP-BC, AOCNP 02/25/18

## 2018-02-25 NOTE — Assessment & Plan Note (Signed)
She has mild peripheral neuropathy secondary to chemotherapy.  We will continue to observe.

## 2018-02-25 NOTE — Assessment & Plan Note (Signed)
Due to chemotherapy.  She has fatigue related to her anemia.  We will continue to monitor this.  No transfusion is indicated at this time.  White blood cell count is normal.  Platelets are slightly low at 121,000.  No bleeding noted.

## 2018-02-25 NOTE — Assessment & Plan Note (Signed)
Due to diuretics.  Potassium level has improved to 3.1 today.  Continue K-Dur 20 mEq daily.  We discussed increasing potassium rich foods in her diet.

## 2018-02-25 NOTE — Patient Instructions (Signed)
   [facility] Discharge Instructions for Patients Receiving Chemotherapy  Today you received the following chemotherapy agents Taxol and Carboplatin   To help prevent nausea and vomiting after your treatment, we encourage you to take your nausea medication as directed.    If you develop nausea and vomiting that is not controlled by your nausea medication, call the clinic.   BELOW ARE SYMPTOMS THAT SHOULD BE REPORTED IMMEDIATELY:  *FEVER GREATER THAN 100.5 F  *CHILLS WITH OR WITHOUT FEVER  NAUSEA AND VOMITING THAT IS NOT CONTROLLED WITH YOUR NAUSEA MEDICATION  *UNUSUAL SHORTNESS OF BREATH  *UNUSUAL BRUISING OR BLEEDING  TENDERNESS IN MOUTH AND THROAT WITH OR WITHOUT PRESENCE OF ULCERS  *URINARY PROBLEMS  *BOWEL PROBLEMS  UNUSUAL RASH Items with * indicate a potential emergency and should be followed up as soon as possible.  Feel free to call the clinic should you have any questions or concerns. The clinic phone number is [phone].  Please show the CHEMO ALERT CARD at check-in to the Emergency Department and triage nurse.   

## 2018-02-25 NOTE — Assessment & Plan Note (Signed)
This is a pleasant 62 year old African-American female with endometrial cancer.  Currently on carboplatin and Taxol.  Tolerating her treatment well with the exception of pancytopenia and hyperglycemia.  She has mild peripheral neuropathy which has not worsened.  Labs been reviewed.  Recommend for her to proceed with cycle 6 of her treatment today as scheduled.  Patient case was reviewed with Dr. Julien Nordmann and Dr. Calton Dach absence.  She will have a restaging CT scan of the abdomen and pelvis in approximately 6 weeks and follow-up with Dr. Alvy Bimler 1 to 2 days after the CT scan.

## 2018-02-25 NOTE — Telephone Encounter (Signed)
Scheduled appt per 1/14 los - sent reminder letter in the mail with appt date and time   

## 2018-02-26 ENCOUNTER — Other Ambulatory Visit: Payer: Self-pay | Admitting: Hematology and Oncology

## 2018-03-22 ENCOUNTER — Other Ambulatory Visit: Payer: Self-pay | Admitting: Hematology and Oncology

## 2018-03-22 DIAGNOSIS — E876 Hypokalemia: Secondary | ICD-10-CM

## 2018-03-29 ENCOUNTER — Other Ambulatory Visit: Payer: Self-pay | Admitting: Hematology and Oncology

## 2018-04-04 ENCOUNTER — Telehealth: Payer: Self-pay | Admitting: *Deleted

## 2018-04-04 NOTE — Telephone Encounter (Signed)
Called and scheduled the patient to see Dr. Denman George on December 17th at 3:45pm

## 2018-04-05 ENCOUNTER — Other Ambulatory Visit: Payer: Self-pay | Admitting: Hematology and Oncology

## 2018-04-05 ENCOUNTER — Other Ambulatory Visit: Payer: Self-pay | Admitting: *Deleted

## 2018-04-05 MED ORDER — POTASSIUM CHLORIDE CRYS ER 20 MEQ PO TBCR: 20.0000 meq | EXTENDED_RELEASE_TABLET | Freq: Every day | ORAL | 0 refills | Status: AC

## 2018-04-05 NOTE — Telephone Encounter (Signed)
Patient called to refill potassium. Last lab resulted 11/4- potassium 3.1 at current dose. Next appointment with Dr. Alvy Bimler 12/24. Forwarded message with medication pended.

## 2018-04-05 NOTE — Telephone Encounter (Signed)
OK to refill I will try electronic refills

## 2018-04-08 ENCOUNTER — Inpatient Hospital Stay: Payer: BLUE CROSS/BLUE SHIELD | Attending: Gynecologic Oncology

## 2018-04-08 ENCOUNTER — Inpatient Hospital Stay: Payer: BLUE CROSS/BLUE SHIELD

## 2018-04-08 ENCOUNTER — Ambulatory Visit (HOSPITAL_COMMUNITY)
Admission: RE | Admit: 2018-04-08 | Discharge: 2018-04-08 | Disposition: A | Discharge status: Home / Self Care | Payer: BLUE CROSS/BLUE SHIELD | Source: Ambulatory Visit | Attending: Oncology | Admitting: Oncology

## 2018-04-08 ENCOUNTER — Other Ambulatory Visit: Payer: BLUE CROSS/BLUE SHIELD

## 2018-04-08 DIAGNOSIS — Z90722 Acquired absence of ovaries, bilateral: Secondary | ICD-10-CM | POA: Insufficient documentation

## 2018-04-08 DIAGNOSIS — C541 Malignant neoplasm of endometrium: Secondary | ICD-10-CM

## 2018-04-08 DIAGNOSIS — Z90710 Acquired absence of both cervix and uterus: Secondary | ICD-10-CM | POA: Diagnosis not present

## 2018-04-08 DIAGNOSIS — Z9221 Personal history of antineoplastic chemotherapy: Secondary | ICD-10-CM | POA: Insufficient documentation

## 2018-04-08 DIAGNOSIS — Z923 Personal history of irradiation: Secondary | ICD-10-CM | POA: Insufficient documentation

## 2018-04-08 DIAGNOSIS — E876 Hypokalemia: Secondary | ICD-10-CM

## 2018-04-08 LAB — CMP (CANCER CENTER ONLY)
ALT: 17 U/L (ref 0–44)
AST: 15 U/L (ref 15–41)
Albumin: 4 g/dL (ref 3.5–5.0)
Alkaline Phosphatase: 92 U/L (ref 38–126)
Anion gap: 13 (ref 5–15)
BUN: 13 mg/dL (ref 8–23)
CHLORIDE: 97 mmol/L — AB (ref 98–111)
CO2: 30 mmol/L (ref 22–32)
Calcium: 10.1 mg/dL (ref 8.9–10.3)
Creatinine: 0.76 mg/dL (ref 0.44–1.00)
GFR, Est AFR Am: >60 mL/min (ref >60)
GFR, Estimated: >60 mL/min (ref >60)
Glucose, Bld: 100 mg/dL — ABNORMAL HIGH (ref 70–99)
Potassium: 3.1 mmol/L — ABNORMAL LOW (ref 3.5–5.1)
Sodium: 140 mmol/L (ref 135–145)
Total Bilirubin: 0.3 mg/dL (ref 0.3–1.2)
Total Protein: 8 g/dL (ref 6.5–8.1)

## 2018-04-08 LAB — CBC WITH DIFFERENTIAL (CANCER CENTER ONLY)
Abs Immature Granulocytes: 0.02 10*3/uL (ref 0.00–0.07)
Basophils Absolute: 0 10*3/uL (ref 0.0–0.1)
Basophils Relative: 0 %
Eosinophils Absolute: 0.1 10*3/uL (ref 0.0–0.5)
Eosinophils Relative: 1 %
HCT: 26.3 % — ABNORMAL LOW (ref 36.0–46.0)
HEMOGLOBIN: 8.7 g/dL — AB (ref 12.0–15.0)
Immature Granulocytes: 0 %
Lymphocytes Relative: 23 %
Lymphs Abs: 1.5 10*3/uL (ref 0.7–4.0)
MCH: 32.8 pg (ref 26.0–34.0)
MCHC: 33.1 g/dL (ref 30.0–36.0)
MCV: 99.2 fL (ref 80.0–100.0)
MONOS PCT: 13 %
Monocytes Absolute: 0.8 10*3/uL (ref 0.1–1.0)
Neutro Abs: 3.9 10*3/uL (ref 1.7–7.7)
Neutrophils Relative %: 63 %
Platelet Count: 244 10*3/uL (ref 150–400)
RBC: 2.65 MIL/uL — ABNORMAL LOW (ref 3.87–5.11)
RDW: 18.8 % — ABNORMAL HIGH (ref 11.5–15.5)
WBC Count: 6.2 10*3/uL (ref 4.0–10.5)
nRBC: 0.5 % — ABNORMAL HIGH (ref 0.0–0.2)

## 2018-04-08 LAB — MAGNESIUM: Magnesium: 1.7 mg/dL (ref 1.7–2.4)

## 2018-04-08 MED ORDER — SODIUM CHLORIDE (PF) 0.9 % IJ SOLN
INTRAMUSCULAR | Status: AC
  Filled 2018-04-08: qty 50

## 2018-04-08 MED ORDER — IOHEXOL 300 MG/ML  SOLN
100.0000 mL | Freq: Once | INTRAMUSCULAR | Status: AC | PRN
  Administered 2018-04-08: 100 mL via INTRAVENOUS

## 2018-04-08 MED ORDER — HEPARIN SOD (PORK) LOCK FLUSH 100 UNIT/ML IV SOLN: 500.0000 [IU] | Freq: Once | INTRAVENOUS | Status: DC

## 2018-04-08 MED ORDER — HEPARIN SOD (PORK) LOCK FLUSH 100 UNIT/ML IV SOLN
INTRAVENOUS | Status: AC
  Filled 2018-04-08: qty 5

## 2018-04-08 MED ORDER — SODIUM CHLORIDE 0.9% FLUSH
10.0000 mL | Freq: Once | INTRAVENOUS | Status: AC
  Administered 2018-04-08: 10 mL
  Filled 2018-04-08: qty 10

## 2018-04-09 ENCOUNTER — Inpatient Hospital Stay (HOSPITAL_BASED_OUTPATIENT_CLINIC_OR_DEPARTMENT_OTHER): Payer: BLUE CROSS/BLUE SHIELD | Admitting: Gynecologic Oncology

## 2018-04-09 ENCOUNTER — Encounter: Payer: Self-pay | Admitting: Gynecologic Oncology

## 2018-04-09 VITALS — BP 140/70 | HR 109 | Temp 98.4°F | Resp 16 | Ht 63.5 in | Wt 242.9 lb

## 2018-04-09 DIAGNOSIS — Z9221 Personal history of antineoplastic chemotherapy: Secondary | ICD-10-CM

## 2018-04-09 DIAGNOSIS — Z90710 Acquired absence of both cervix and uterus: Secondary | ICD-10-CM

## 2018-04-09 DIAGNOSIS — C541 Malignant neoplasm of endometrium: Secondary | ICD-10-CM | POA: Diagnosis not present

## 2018-04-09 DIAGNOSIS — Z90722 Acquired absence of ovaries, bilateral: Secondary | ICD-10-CM

## 2018-04-09 DIAGNOSIS — Z923 Personal history of irradiation: Secondary | ICD-10-CM | POA: Diagnosis not present

## 2018-04-09 NOTE — Progress Notes (Signed)
Follow-up Note: Gyn-Onc  Consult was requested by Dr. Raphael Gibney for the evaluation of Morgan Vincent 62 y.o. female  CC:  Chief Complaint  Patient presents with  . Endometrial cancer [facility])    Assessment/Plan:  Morgan Vincent  is a 62 y.o.  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  S/p 6 cycles carboplatin and paclitaxel and vaginal brachytherapy adjuvant therapy completed November, 2019.  I recommend q 3 monthly surveillance visits with symptom assessment, pelvic examination. Pap not indicated at these visits. She will return to see me in 6 months and Dr Sondra Come in 3 months.   HPI: Morgan Vincent is a 62 year old woman who is seen in consultation at the request of Dr Raphael Gibney for a high grade endometrial cancer.  Patient developed severe abdominal pains in the abdomen on Sep 18, 2017.  This became worse overnight and she presented to [facility] emergency room department on Sep 19, 2017.  Her pains were crampy and throughout the upper and lower abdomen.  A CT scan of the abdomen and pelvis was performed which revealed mild inflammation involving distal small bowel loops including the terminal ileum with no obstruction, diverticulosis without definite diverticulitis.  No abscess.  The uterus was markedly abnormal with large heterogeneous soft tissues within the endometrial cavity.  There are multiple calcified fibroids identified.  She was presumed to have a diagnosis of gastroenteritis and was discharged home with recommendation to follow-up with her OB/GYN provider for work-up of her abnormal uterine findings.  She saw Dr. Raphael Gibney on May 31st, 2019 and a Pap smear was obtained which revealed atypical glandular cells, HPV negative.  An endometrial biopsy was performed which revealed minute fragments of malignant neoplasm, favor high-grade carcinoma.  There is small amount of of tissue was too small for further evaluation  with immunohistochemistry.  I recommendation to correlate with clinical findings is suggested.  The patient is obese, she has a history of hypertension, and asthma.  She has had a prior laparoscopic fibroid surgery (possible myomectomy).  She is nulliparous.  She had no other abdominal surgeries.  Her last menstrual period was in her 26s and she has had no postmenopausal bleeding until she had a biopsy which diagnosed her endometrial cancer.  Her family history significant for father who died of liver cancer, a paternal uncle who died of cancer of unknown origin, and a maternal aunt who died of cancer of unknown origin.  The patient has been diagnosed with diabetes, however she is not treated with medication for this.  Her last colonoscopy was 2 3 years ago.  She had a normal Pap smear in 2017.  On 10/15/17 she underwent robotic assisted total hysterectomy >250gm, BSO, SLN biopsy and right pelvic and para-aortic lymphadenectomy. A minilaparotomy was necessary to delivery the 350gm specimen. Additional sutures were placed at the introitus for bleeding at that site due to manipulator placement.  Postop she did well though she developed left genitofemoral nerve dysfunction.  Final pathology revealed a 8.2cm grade 2 endometrioid endometrial cancer with deep myometrial invasion (outer half) and LVSI was present. The right obturator SLN was positive for macrometastatic disease, though the left pelvic and para-aortic lymphadenectomy specimens were free of disease. She was staged as IIIC1 grade 2 endometrioid endometrial adenocarcinoma and adjuvant carboplatin and paclitaxel chemotherapy x 6 cycles with vaginal brachytherapy was prescribed in accordance with NCCN guidelines.  Interval Hx:  She went on to  complete 6 cycles of carboplatin and paclitaxel chemotherapy between 10/2217 and 02/25/18, and vaginal brachytherapy (30Gy in 5 fractions x 6Gy completed in October, 2019).  She did well with therapy with some  neuropathy.   Current Meds:  Outpatient Encounter Medications as of 04/09/2018  Medication Sig  . acetaminophen (TYLENOL) 325 MG tablet Take 650 mg by mouth every 6 (six) hours as needed for mild pain.  Marland Kitchen albuterol (PROVENTIL HFA;VENTOLIN HFA) 108 (90 Base) MCG/ACT inhaler Inhale 2 puffs into the lungs every 6 (six) hours as needed for wheezing or shortness of breath.  . Ascorbic Acid (VITAMIN C) 1000 MG tablet Take 1,000 mg by mouth daily.  Marland Kitchen aspirin 81 MG tablet Take 81 mg by mouth daily.  Marland Kitchen CALCIUM PO Take 1 tablet by mouth daily.  Marland Kitchen dexamethasone (DECADRON) 4 MG tablet Take 3 tabs at the night before and 3 tabs the morning of chemotherapy, every 3 weeks, by mouth  . hydrochlorothiazide (HYDRODIURIL) 50 MG tablet Take 50 mg by mouth daily  . ibuprofen (ADVIL,MOTRIN) 800 MG tablet ibuprofen 800 mg tablet  . lidocaine-prilocaine (EMLA) cream Apply to affected area once  . loratadine (CLARITIN) 10 MG tablet loratadine 10 mg tablet  TAKE 1 TABLET (10 MG TOTAL) BY MOUTH DAILY.  Marland Kitchen ondansetron (ZOFRAN) 8 MG tablet Take 1 tablet (8 mg total) by mouth every 8 (eight) hours as needed for refractory nausea / vomiting. Start on day 3 after chemo.  . potassium chloride SA (KLOR-CON M20) 20 MEQ tablet Take 1 tablet (20 mEq total) by mouth daily.  Marland Kitchen PROBIOTIC PRODUCT PO Take 1 capsule by mouth daily.   . prochlorperazine (COMPAZINE) 10 MG tablet Take 1 tablet (10 mg total) by mouth every 6 (six) hours as needed (Nausea or vomiting). (Patient not taking: Reported on 02/25/2018)  . vitamin E 1000 UNIT capsule Take 1,000 Units by mouth daily.  Marland Kitchen zinc gluconate 50 MG tablet Take 50 mg by mouth daily.   No facility-administered encounter medications on file as of 04/09/2018.     Allergy: No Known Allergies  Social Hx:   Social History   Socioeconomic History  . Marital status: Single    Spouse name: Not on file  . Number of children: Not on file  . Years of education: Not on file  . Highest  education level: Not on file  Occupational History  . Not on file  Social Needs  . Financial resource strain: Not on file  . Food insecurity:    Worry: Not on file    Inability: Not on file  . Transportation needs:    Medical: Not on file    Non-medical: Not on file  Tobacco Use  . Smoking status: Never Smoker  . Smokeless tobacco: Never Used  Substance and Sexual Activity  . Alcohol use: No  . Drug use: No  . Sexual activity: Not Currently    Partners: Male    Birth control/protection: None  Lifestyle  . Physical activity:    Days per week: Not on file    Minutes per session: Not on file  . Stress: Not on file  Relationships  . Social connections:    Talks on phone: Not on file    Gets together: Not on file    Attends religious service: Not on file    Active member of club or organization: Not on file    Attends meetings of clubs or organizations: Not on file    Relationship status: Not on file  .  Intimate partner violence:    Fear of current or ex partner: Not on file    Emotionally abused: Not on file    Physically abused: Not on file    Forced sexual activity: Not on file  Other Topics Concern  . Not on file  Social History Narrative  . Not on file    Past Surgical Hx:  Past Surgical History:  Procedure Laterality Date  . IR IMAGING GUIDED PORT INSERTION  11/08/2017  . LAPAROTOMY N/A 10/15/2017   Procedure: MINI LAPAROTOMY;  Surgeon: Everitt Amber, MD;  Location: [facility];  Service: Gynecology;  Laterality: N/A;  . MYOMECTOMY    . ROBOTIC ASSISTED TOTAL HYSTERECTOMY WITH BILATERAL SALPINGO OOPHERECTOMY Bilateral 10/15/2017   Procedure: XI ROBOTIC ASSISTED TOTAL HYSTERECTOMY GREATER THAN 250 GRAMS WITH BILATERAL SALPINGO OOPHORECTOMY, SENTINAL LYMPH NODE BIOPSY , PERI AND PARA- [place]un LYMPH NODE DISECTIOIN;  Surgeon: Everitt Amber, MD;  Location: [facility];  Service: Gynecology;  Laterality: Bilateral;    Past Medical Hx:  Past Medical History:  Diagnosis Date  .  Asthma   . Diabetes mellitus without complication ([place])   . Fibroids   . Hypertension     Past Gynecological History:  No history of abnormal paps (last normal pap was 2017). G0, history of fibroids. No LMP recorded. Patient has had a hysterectomy.  Family Hx:  Family History  Problem Relation Age of Onset  . Alzheimer's disease Mother   . Liver cancer Father   . Cancer Maternal Aunt     Review of Systems:  Constitutional  Feels well,    ENT Normal appearing ears and nares bilaterally Skin/Breast  No rash, sores, jaundice, itching, dryness Cardiovascular  No chest pain, shortness of breath, or edema  Pulmonary  No cough or wheeze.  Gastro Intestinal  No nausea, vomitting, or diarrhoea. No bright red blood per rectum, no abdominal pain, change in bowel movement, or constipation.  Genito Urinary  No frequency, urgency, dysuria, no bleeding Musculo Skeletal  No myalgia, arthralgia, joint swelling or pain  Neurologic  No weakness, numbness, change in gait,  Psychology  No depression, anxiety, insomnia.   Vitals:  There were no vitals taken for this visit.  Physical Exam: WD in NAD Neck  Supple NROM, without any enlargements.  Lymph Node Survey No cervical supraclavicular or inguinal adenopathy Cardiovascular  Pulse normal rate, regularity and rhythm. S1 and S2 normal.  Lungs  Clear to auscultation bilateraly, without wheezes/crackles/rhonchi. Good air movement.  Skin  No rash/lesions/breakdown  Psychiatry  Alert and oriented to person, place, and time  Abdomen  Normoactive bowel sounds, abdomen soft, non-tender and obese without evidence of hernia.  Incisions well healed. Back No CVA tenderness Genito Urinary  Vaginal cuff smooth, no lesions, no blood.  Rectal: deferred  Extremities  No bilateral cyanosis, clubbing or edema.   Thereasa Solo, MD  04/09/2018, 3:54 PM

## 2018-04-09 NOTE — Patient Instructions (Signed)
Please notify Dr Denman George at phone number [phone] if you notice vaginal bleeding, new pelvic or abdominal pains, bloating, feeling full easy, or a change in bladder or bowel function.   You can try copper sox and vitamin B6 for your nerve pain.   Please return to see Dr Denman George in June, 2020.  It is safe to have your port removed.

## 2018-04-10 ENCOUNTER — Telehealth: Payer: Self-pay | Admitting: *Deleted

## 2018-04-10 ENCOUNTER — Other Ambulatory Visit: Payer: Self-pay | Admitting: Gynecologic Oncology

## 2018-04-10 DIAGNOSIS — Z95828 Presence of other vascular implants and grafts: Secondary | ICD-10-CM

## 2018-04-10 NOTE — Telephone Encounter (Signed)
Returned patient's call concerning her port-a-cath removal. Per Joylene John, NP Dr. Denman George states that it will be fine to have it removed.  I informed the patient that scheduling would be call her to set up the port-a-cath removal.  Patient was very appreciative, and verbalized understanding.

## 2018-04-10 NOTE — Progress Notes (Signed)
Patient called wanting to have port removed. Cleared for removal per Dr. Denman George.

## 2018-04-11 ENCOUNTER — Ambulatory Visit: Payer: BLUE CROSS/BLUE SHIELD | Admitting: Hematology and Oncology

## 2018-04-11 ENCOUNTER — Other Ambulatory Visit: Payer: Self-pay | Admitting: Hematology and Oncology

## 2018-04-11 DIAGNOSIS — C541 Malignant neoplasm of endometrium: Secondary | ICD-10-CM

## 2018-04-16 ENCOUNTER — Inpatient Hospital Stay: Payer: BLUE CROSS/BLUE SHIELD | Admitting: Hematology and Oncology

## 2018-04-18 ENCOUNTER — Other Ambulatory Visit: Payer: Self-pay | Admitting: Student

## 2018-04-19 ENCOUNTER — Ambulatory Visit (HOSPITAL_COMMUNITY)
Admission: RE | Admit: 2018-04-19 | Discharge: 2018-04-19 | Disposition: A | Discharge status: Home / Self Care | Payer: BLUE CROSS/BLUE SHIELD | Source: Ambulatory Visit | Attending: Gynecologic Oncology | Admitting: Gynecologic Oncology

## 2018-04-19 ENCOUNTER — Other Ambulatory Visit: Payer: Self-pay

## 2018-04-19 ENCOUNTER — Ambulatory Visit (HOSPITAL_COMMUNITY)
Admission: RE | Admit: 2018-04-19 | Discharge: 2018-04-19 | Disposition: A | Discharge status: Home / Self Care | Payer: BLUE CROSS/BLUE SHIELD | Source: Ambulatory Visit | Attending: Hematology and Oncology | Admitting: Hematology and Oncology

## 2018-04-19 ENCOUNTER — Encounter (HOSPITAL_COMMUNITY): Payer: Self-pay

## 2018-04-19 DIAGNOSIS — Z452 Encounter for adjustment and management of vascular access device: Secondary | ICD-10-CM | POA: Insufficient documentation

## 2018-04-19 DIAGNOSIS — Z95828 Presence of other vascular implants and grafts: Secondary | ICD-10-CM

## 2018-04-19 HISTORY — PX: IR REMOVAL TUN ACCESS W/ PORT W/O FL MOD SED: IMG2290

## 2018-04-19 LAB — CBC
HCT: 30.5 % — ABNORMAL LOW (ref 36.0–46.0)
Hemoglobin: 9.7 g/dL — ABNORMAL LOW (ref 12.0–15.0)
MCH: 33.1 pg (ref 26.0–34.0)
MCHC: 31.8 g/dL (ref 30.0–36.0)
MCV: 104.1 fL — ABNORMAL HIGH (ref 80.0–100.0)
Platelets: 284 10*3/uL (ref 150–400)
RBC: 2.93 MIL/uL — ABNORMAL LOW (ref 3.87–5.11)
RDW: 17.6 % — ABNORMAL HIGH (ref 11.5–15.5)
WBC: 5.5 10*3/uL (ref 4.0–10.5)
nRBC: 0 % (ref 0.0–0.2)

## 2018-04-19 LAB — PROTIME-INR
INR: 0.99
Prothrombin Time: 13 seconds (ref 11.4–15.2)

## 2018-04-19 LAB — APTT: aPTT: 26 seconds (ref 24–36)

## 2018-04-19 MED ORDER — CEFAZOLIN SODIUM-DEXTROSE 2-4 GM/100ML-% IV SOLN
INTRAVENOUS | Status: AC
  Administered 2018-04-19: 2 g via INTRAVENOUS
  Filled 2018-04-19: qty 100

## 2018-04-19 MED ORDER — LIDOCAINE HCL (PF) 1 % IJ SOLN
INTRAMUSCULAR | Status: DC | PRN
  Administered 2018-04-19: 5 mL via INTRADERMAL

## 2018-04-19 MED ORDER — CEFAZOLIN SODIUM-DEXTROSE 2-4 GM/100ML-% IV SOLN
2.0000 g | INTRAVENOUS | Status: AC
  Administered 2018-04-19: 2 g via INTRAVENOUS

## 2018-04-19 MED ORDER — SODIUM CHLORIDE 0.9 % IV SOLN
INTRAVENOUS | Status: DC
  Administered 2018-04-19: 14:00:00 via INTRAVENOUS

## 2018-04-19 MED ORDER — LIDOCAINE HCL 1 % IJ SOLN
INTRAMUSCULAR | Status: AC
  Filled 2018-04-19: qty 20

## 2018-04-19 NOTE — Discharge Instructions (Addendum)
Implanted Port Removal, Care After  This sheet gives you information about how to care for yourself after your procedure. Your health care provider may also give you more specific instructions. If you have problems or questions, contact your health care provider.  What can I expect after the procedure?  After the procedure, it is common to have:   Soreness or pain near your incision.   Some swelling or bruising near your incision.  Follow these instructions at home:  Medicines   Take over-the-counter and prescription medicines only as told by your health care provider.   If you were prescribed an antibiotic medicine, take it as told by your health care provider. Do not stop taking the antibiotic even if you start to feel better.  Bathing   Do not take baths, swim, or use a hot tub until your health care provider approves. Ask your health care provider if you can take showers. You may only be allowed to take sponge baths.  Incision care     Follow instructions from your health care provider about how to take care of your incision. Make sure you:  ? Wash your hands with soap and water before you change your bandage (dressing). If soap and water are not available, use hand sanitizer.  ? Change your dressing as told by your health care provider.  ? Keep your dressing dry.  ? Leave stitches (sutures), skin glue, or adhesive strips in place. These skin closures may need to stay in place for 2 weeks or longer. If adhesive strip edges start to loosen and curl up, you may trim the loose edges. Do not remove adhesive strips completely unless your health care provider tells you to do that.   Check your incision area every day for signs of infection. Check for:  ? More redness, swelling, or pain.  ? More fluid or blood.  ? Warmth.  ? Pus or a bad smell.  Driving     Do not drive for 24 hours if you were given a medicine to help you relax (sedative) during your procedure.   If you did not receive a sedative, ask  your health care provider when it is safe to drive.  Activity   Return to your normal activities as told by your health care provider. Ask your health care provider what activities are safe for you.   Do not lift anything that is heavier than 10 lb (4.5 kg), or the limit that you are told, until your health care provider says that it is safe.   Do not do activities that involve lifting your arms over your head.  General instructions   Do not use any products that contain nicotine or tobacco, such as cigarettes and e-cigarettes. These can delay healing. If you need help quitting, ask your health care provider.   Keep all follow-up visits as told by your health care provider. This is important.  Contact a health care provider if:   You have more redness, swelling, or pain around your incision.   You have more fluid or blood coming from your incision.   Your incision feels warm to the touch.   You have pus or a bad smell coming from your incision.   You have pain that is not relieved by your pain medicine.  Get help right away if you have:   A fever or chills.   Chest pain.   Difficulty breathing.  Summary   After the procedure, it is common to   have pain, soreness, swelling, or bruising near your incision.   If you were prescribed an antibiotic medicine, take it as told by your health care provider. Do not stop taking the antibiotic even if you start to feel better.   Do not drive for 24 hours if you were given a sedative during your procedure.   Return to your normal activities as told by your health care provider. Ask your health care provider what activities are safe for you.  This information is not intended to replace advice given to you by your health care provider. Make sure you discuss any questions you have with your health care provider.  Document Released: 03/22/2015 Document Revised: 05/24/2017 Document Reviewed: 05/24/2017  Elsevier Interactive Patient Education  2019 [employer] Inc.

## 2018-04-19 NOTE — Progress Notes (Signed)
Patient ID: Morgan Vincent, female   DOB: 10-30-1955, 62 y.o.   MRN: [id number] Pt presents to IR dept today for port a cath removal. She has a hx of endometrial cancer and has completed treatment. Details/risks of procedure, including but not limited to, internal bleeding, infection, injury to adjacent structures d/w pt with her apparent understanding and consent. Pt does not wish to have IV conscious sedation for procedure.

## 2018-05-01 ENCOUNTER — Ambulatory Visit: Payer: BLUE CROSS/BLUE SHIELD | Admitting: Gynecologic Oncology

## 2018-06-27 ENCOUNTER — Telehealth: Payer: Self-pay

## 2018-06-27 NOTE — Telephone Encounter (Signed)
Incoming call from patient regarding need for operative report to be sent by fax to her insurance.  Notified Joylene John NP. Faxed operative report [facility] [phone].  No other needs per pt at this time.

## 2018-06-28 ENCOUNTER — Telehealth: Payer: Self-pay

## 2018-06-28 ENCOUNTER — Other Ambulatory Visit: Payer: Self-pay | Admitting: Hematology and Oncology

## 2018-06-28 NOTE — Telephone Encounter (Signed)
She only needs follow-up with Dr. Sondra Come and Dr. Denman George Previously I discussed that her low potassium was due to her diuretic that was prescribed by her PCP She needs follow-up with her primary care doctor for blood count monitoring

## 2018-06-28 NOTE — Telephone Encounter (Signed)
Pt called stating her refill for potassium pill was denied. Do you want her to discontinue and go back on MVI with potassium?

## 2018-06-28 NOTE — Telephone Encounter (Signed)
Her last potassium is OK Since I am not following her with labs, she needs to follow-up with PCP MVI with potassium is fine

## 2018-06-28 NOTE — Telephone Encounter (Signed)
I informed pt that she no longer needed potassium prescription.  She was unclear as to whether or not she needed to be seen by you again.  Please advise

## 2018-07-04 ENCOUNTER — Telehealth: Payer: Self-pay | Admitting: *Deleted

## 2018-07-04 NOTE — Telephone Encounter (Signed)
CALLED PATIENT TO ALTER FU ON 07-15-18 TO 08-19-18 @ 10:15 AM, LVM FOR A RETURN CALL

## 2018-07-15 ENCOUNTER — Ambulatory Visit: Payer: Self-pay | Admitting: Radiation Oncology

## 2018-08-15 ENCOUNTER — Telehealth: Payer: Self-pay | Admitting: *Deleted

## 2018-08-15 NOTE — Telephone Encounter (Signed)
CALLED PATIENT TO ALTER FU FOR 08-19-18, APPT. RESCHEDULED FOR 09-26-18 @ 4 PM, PATIENT AGREED TO NEW DATE AND TIME

## 2018-08-19 ENCOUNTER — Ambulatory Visit: Payer: BLUE CROSS/BLUE SHIELD | Admitting: Radiation Oncology

## 2018-09-23 ENCOUNTER — Encounter: Payer: Self-pay | Admitting: Hematology and Oncology

## 2018-09-26 ENCOUNTER — Ambulatory Visit
Admission: RE | Admit: 2018-09-26 | Discharge: 2018-09-26 | Disposition: A | Discharge status: Home / Self Care | Payer: BLUE CROSS/BLUE SHIELD | Source: Ambulatory Visit | Attending: Radiation Oncology | Admitting: Radiation Oncology

## 2018-09-26 ENCOUNTER — Encounter: Payer: Self-pay | Admitting: Radiation Oncology

## 2018-09-26 ENCOUNTER — Other Ambulatory Visit: Payer: Self-pay

## 2018-09-26 VITALS — BP 142/68 | HR 97 | Temp 97.8°F | Resp 18 | Ht 64.0 in | Wt 248.5 lb

## 2018-09-26 DIAGNOSIS — Z79899 Other long term (current) drug therapy: Secondary | ICD-10-CM | POA: Insufficient documentation

## 2018-09-26 DIAGNOSIS — Z8542 Personal history of malignant neoplasm of other parts of uterus: Secondary | ICD-10-CM | POA: Insufficient documentation

## 2018-09-26 DIAGNOSIS — C541 Malignant neoplasm of endometrium: Secondary | ICD-10-CM

## 2018-09-26 DIAGNOSIS — Z923 Personal history of irradiation: Secondary | ICD-10-CM | POA: Diagnosis not present

## 2018-09-26 NOTE — Patient Instructions (Signed)
Coronavirus (COVID-19) Are you at risk?  Are you at risk for the Coronavirus (COVID-19)?  To be considered HIGH RISK for Coronavirus (COVID-19), you have to meet the following criteria:  . Traveled to [place], [place], [place], [place] or [place]; or in the [place] to [place], [place], [place], or [place]; and have fever, cough, and shortness of breath within the last 2 weeks of travel OR . Been in close contact with a person diagnosed with COVID-19 within the last 2 weeks and have fever, cough, and shortness of breath . IF YOU DO NOT MEET THESE CRITERIA, YOU ARE CONSIDERED LOW RISK FOR COVID-19.  What to do if you are HIGH RISK for COVID-19?  . If you are having a medical emergency, call 911. . Seek medical care right away. Before you go to a doctor's office, urgent care or emergency department, call ahead and tell them about your recent travel, contact with someone diagnosed with COVID-19, and your symptoms. You should receive instructions from your physician's office regarding next steps of care.  . When you arrive at healthcare provider, tell the healthcare staff immediately you have returned from visiting [place], [place], [place], [place] or [place]; or traveled in the [place] to [place], [place], [place], or [place]; in the last two weeks or you have been in close contact with a person diagnosed with COVID-19 in the last 2 weeks.   . Tell the health care staff about your symptoms: fever, cough and shortness of breath. . After you have been seen by a medical provider, you will be either: o Tested for (COVID-19) and discharged home on quarantine except to seek medical care if symptoms worsen, and asked to  - Stay home and avoid contact with others until you get your results (4-5 days)  - Avoid travel on public transportation if possible (such as bus, train, or airplane) or o Sent to the Emergency Department by EMS for evaluation, COVID-19 testing, and possible  admission depending on your condition and test results.  What to do if you are LOW RISK for COVID-19?  Reduce your risk of any infection by using the same precautions used for avoiding the common cold or flu:  . Wash your hands often with soap and warm water for at least 20 seconds.  If soap and water are not readily available, use an alcohol-based hand sanitizer with at least 60% alcohol.  . If coughing or sneezing, cover your mouth and nose by coughing or sneezing into the elbow areas of your shirt or coat, into a tissue or into your sleeve (not your hands). . Avoid shaking hands with others and consider head nods or verbal greetings only. . Avoid touching your eyes, nose, or mouth with unwashed hands.  . Avoid close contact with people who are sick. . Avoid places or events with large numbers of people in one location, like concerts or sporting events. . Carefully consider travel plans you have or are making. . If you are planning any travel outside or inside the [place], visit the CDC's Travelers' Health webpage for the latest health notices. . If you have some symptoms but not all symptoms, continue to monitor at home and seek medical attention if your symptoms worsen. . If you are having a medical emergency, call 911.   ADDITIONAL HEALTHCARE OPTIONS FOR PATIENTS  [facility] / e-Visit: [email or web address]         [facility]: [phone]  [facility]: [phone]                   [facility]: [phone]   

## 2018-09-26 NOTE — Progress Notes (Signed)
Radiation Oncology         [phone] ________________________________  Name: Morgan Vincent MRN: [id number]  Date: 09/26/2018  DOB: 1955/10/24  Follow-Up Visit Note  CC: [name], MD  Everitt Amber, MD    ICD-10-CM   1. Endometrial cancer (HCC) C54.1     Diagnosis:   63 y.o. female with Stage IIIC1 (pT1a, pN1a, cM0) invasive endometrioid adenocarcinoma, grade 2, with LVSI  Interval Since Last Radiation:  9 months  HDR Radiation treatment dates:   12/12/17 - 01/10/18 Site/dose:   Vaginal Cuff / 30 Gy in 5 fractions of 6 Gy  Narrative:  The patient returns today for routine follow-up.  She was last seen by Dr. Denman George 6 months ago with no evidence of disease on clinical exam or CT imaging.             On review of systems, the patient reports that she is doing well overall. She denies any GU or GI issues. She is using vaginal dilators as instructed.  ALLERGIES:  has No Known Allergies.  Meds: Current Outpatient Medications  Medication Sig Dispense Refill  . acetaminophen (TYLENOL) 325 MG tablet Take 650 mg by mouth every 6 (six) hours as needed for mild pain.    Marland Kitchen albuterol (PROVENTIL HFA;VENTOLIN HFA) 108 (90 Base) MCG/ACT inhaler Inhale 2 puffs into the lungs every 6 (six) hours as needed for wheezing or shortness of breath.    . Ascorbic Acid (VITAMIN C) 1000 MG tablet Take 1,000 mg by mouth daily.    Marland Kitchen CALCIUM PO Take 1 tablet by mouth daily.    . hydrochlorothiazide (HYDRODIURIL) 50 MG tablet Take 50 mg by mouth daily    . ibuprofen (ADVIL,MOTRIN) 800 MG tablet ibuprofen 800 mg tablet    . meclizine (ANTIVERT) 25 MG tablet     . potassium chloride SA (KLOR-CON M20) 20 MEQ tablet Take 1 tablet (20 mEq total) by mouth daily. 30 tablet 0  . PROBIOTIC PRODUCT PO Take 1 capsule by mouth daily.     Marland Kitchen pyridoxine (B-6) 100 MG tablet Take by mouth.    . Spacer/Aero-Holding Chambers (OPTICHAMBER DIAMOND) MISC USE WITH INHALER UP TO 4 TIMES DAILY    . vitamin E 1000 UNIT  capsule Take 1,000 Units by mouth daily.    Marland Kitchen zinc gluconate 50 MG tablet Take 50 mg by mouth daily.    Marland Kitchen dexamethasone (DECADRON) 4 MG tablet Take 3 tabs at the night before and 3 tabs the morning of chemotherapy, every 3 weeks, by mouth (Patient not taking: Reported on 09/26/2018) 36 tablet 0   No current facility-administered medications for this encounter.     Physical Findings: The patient is in no acute distress. Patient is alert and oriented.  height is 5\' 4"  (1.626 m) and weight is 248 lb 8 oz (112.7 kg). Her temporal temperature is 97.8 F (36.6 C). Her blood pressure is 142/68 (abnormal) and her pulse is 97. Her respiration is 18 and oxygen saturation is 100%.   Lungs are clear to auscultation bilaterally. Heart has regular rate and rhythm. No palpable cervical, supraclavicular, or axillary adenopathy. Abdomen soft, non-tender, normal bowel sounds.  On pelvic examination the external genitalia were unremarkable. A speculum exam was performed. There are no mucosal lesions noted in the vaginal vault. On bimanual examination there were no pelvic masses appreciated.  Lab Findings: Lab Results  Component Value Date   WBC 5.5 04/19/2018   HGB 9.7 (L) 04/19/2018   HCT 30.5 (  L) 04/19/2018   MCV 104.1 (H) 04/19/2018   PLT 284 04/19/2018    Radiographic Findings: No results found.  Impression:  Stage IIIC1 (pT1a, pN1a, cM0) invasive endometrioid adenocarcinoma, grade 2, with LVSI. No evidence of recurrence on clinical exam. Patient delayed her follow-up earlier this year in light of the COVID-19 pandemic. She understands for the next year she will need to be followed every 3 months. Patient continues to use her vaginal dilator as recommended. She reports no bleeding with usage.   Plan:  Patient will follow up with Dr. Denman George in 3 months and radiation oncology in 6 months.  ____________________________________  Blair Promise, PhD, MD  This document serves as a record of services  personally performed by Gery Pray, MD. It was created on his behalf by Rae Lips, a trained medical scribe. The creation of this record is based on the scribe's personal observations and the provider's statements to them. This document has been checked and approved by the attending provider.

## 2018-09-26 NOTE — Progress Notes (Signed)
Patient in for follow up doing well denies any issues or complaints of. Denies GI,GU GYN issues. Using dilators as instructed. Brandt Loosen RN

## 2018-10-09 ENCOUNTER — Inpatient Hospital Stay: Payer: BLUE CROSS/BLUE SHIELD | Attending: Gynecologic Oncology | Admitting: Gynecologic Oncology

## 2018-10-09 ENCOUNTER — Other Ambulatory Visit: Payer: Self-pay

## 2018-10-09 ENCOUNTER — Encounter: Payer: Self-pay | Admitting: Gynecologic Oncology

## 2018-10-09 VITALS — BP 128/73 | HR 79 | Temp 98.3°F | Resp 18 | Ht 64.0 in | Wt 251.6 lb

## 2018-10-09 DIAGNOSIS — Z923 Personal history of irradiation: Secondary | ICD-10-CM | POA: Insufficient documentation

## 2018-10-09 DIAGNOSIS — Z9221 Personal history of antineoplastic chemotherapy: Secondary | ICD-10-CM | POA: Diagnosis not present

## 2018-10-09 DIAGNOSIS — B372 Candidiasis of skin and nail: Secondary | ICD-10-CM | POA: Diagnosis not present

## 2018-10-09 DIAGNOSIS — Z90710 Acquired absence of both cervix and uterus: Secondary | ICD-10-CM | POA: Diagnosis not present

## 2018-10-09 DIAGNOSIS — C541 Malignant neoplasm of endometrium: Secondary | ICD-10-CM | POA: Insufficient documentation

## 2018-10-09 DIAGNOSIS — Z90722 Acquired absence of ovaries, bilateral: Secondary | ICD-10-CM

## 2018-10-09 NOTE — Progress Notes (Signed)
Follow-up Note: Gyn-Onc  Consult was requested by Dr. Raphael Gibney for the evaluation of Morgan Vincent 63 y.o. female  CC:  Chief Complaint  Patient presents with  . Endometrial cancer [facility])    Assessment/Plan:  [name]  is a 63 y.o.  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  S/p 6 cycles carboplatin and paclitaxel and vaginal brachytherapy adjuvant therapy completed November, 2019.  I recommend q 3 monthly surveillance visits with symptom assessment, pelvic examination. Pap not indicated at these visits. She will return to see me in 6 months and Dr Sondra Come in 3 months.   HPI: Morgan Vincent is a 63 year old woman who is seen in consultation at the request of Dr Raphael Gibney for a high grade endometrial cancer.  Patient developed severe abdominal pains in the abdomen on Sep 18, 2017.  This became worse overnight and she presented to [facility] emergency room department on Sep 19, 2017.  Her pains were crampy and throughout the upper and lower abdomen.  A CT scan of the abdomen and pelvis was performed which revealed mild inflammation involving distal small bowel loops including the terminal ileum with no obstruction, diverticulosis without definite diverticulitis.  No abscess.  The uterus was markedly abnormal with large heterogeneous soft tissues within the endometrial cavity.  There are multiple calcified fibroids identified.  She was presumed to have a diagnosis of gastroenteritis and was discharged home with recommendation to follow-up with her OB/GYN provider for work-up of her abnormal uterine findings.  She saw Dr. Raphael Gibney on May 31st, 2019 and a Pap smear was obtained which revealed atypical glandular cells, HPV negative.  An endometrial biopsy was performed which revealed minute fragments of malignant neoplasm, favor high-grade carcinoma.  There is small amount of of tissue was too small for further evaluation  with immunohistochemistry.  I recommendation to correlate with clinical findings is suggested.  The patient is obese, she has a history of hypertension, and asthma.  She has had a prior laparoscopic fibroid surgery (possible myomectomy).  She is nulliparous.  She had no other abdominal surgeries.  Her last menstrual period was in her 26s and she has had no postmenopausal bleeding until she had a biopsy which diagnosed her endometrial cancer.  Her family history significant for father who died of liver cancer, a paternal uncle who died of cancer of unknown origin, and a maternal aunt who died of cancer of unknown origin.  The patient has been diagnosed with diabetes, however she is not treated with medication for this.  Her last colonoscopy was 2 3 years ago.  She had a normal Pap smear in 2017.  On 10/15/17 she underwent robotic assisted total hysterectomy >250gm, BSO, SLN biopsy and right pelvic and para-aortic lymphadenectomy. A minilaparotomy was necessary to delivery the 350gm specimen. Additional sutures were placed at the introitus for bleeding at that site due to manipulator placement.  Postop she did well though she developed left genitofemoral nerve dysfunction.  Final pathology revealed a 8.2cm grade 2 endometrioid endometrial cancer with deep myometrial invasion (outer half) and LVSI was present. The right obturator SLN was positive for macrometastatic disease, though the left pelvic and para-aortic lymphadenectomy specimens were free of disease. She was staged as IIIC1 grade 2 endometrioid endometrial adenocarcinoma and adjuvant carboplatin and paclitaxel chemotherapy x 6 cycles with vaginal brachytherapy was prescribed in accordance with NCCN guidelines.  Interval Hx:  She went on to  complete 6 cycles of carboplatin and paclitaxel chemotherapy between 10/2217 and 02/25/18, and vaginal brachytherapy (30Gy in 5 fractions x 6Gy completed in October, 2019).  She did well with therapy with some  neuropathy - persistent. Candidiasis of abdominal fold. Vulvovaginal dryness.   Today we discussed survivorship issues including sexual function, longstanding toxicities of chemotherapy such as neuropathy, ongoing surveillance schedule, and screening for non-GYN cancers.  Current Meds:  Outpatient Encounter Medications as of 10/09/2018  Medication Sig  . acetaminophen (TYLENOL) 325 MG tablet Take 650 mg by mouth every 6 (six) hours as needed for mild pain.  Marland Kitchen albuterol (PROVENTIL HFA;VENTOLIN HFA) 108 (90 Base) MCG/ACT inhaler Inhale 2 puffs into the lungs every 6 (six) hours as needed for wheezing or shortness of breath.  . Ascorbic Acid (VITAMIN C) 1000 MG tablet Take 1,000 mg by mouth daily.  Marland Kitchen CALCIUM PO Take 1 tablet by mouth daily.  Marland Kitchen dexamethasone (DECADRON) 4 MG tablet Take 3 tabs at the night before and 3 tabs the morning of chemotherapy, every 3 weeks, by mouth (Patient not taking: Reported on 09/26/2018)  . hydrochlorothiazide (HYDRODIURIL) 50 MG tablet Take 50 mg by mouth daily  . ibuprofen (ADVIL,MOTRIN) 800 MG tablet ibuprofen 800 mg tablet  . meclizine (ANTIVERT) 25 MG tablet   . potassium chloride SA (KLOR-CON M20) 20 MEQ tablet Take 1 tablet (20 mEq total) by mouth daily.  Marland Kitchen PROBIOTIC PRODUCT PO Take 1 capsule by mouth daily.   Marland Kitchen pyridoxine (B-6) 100 MG tablet Take by mouth.  . Spacer/Aero-Holding Chambers (OPTICHAMBER DIAMOND) MISC USE WITH INHALER UP TO 4 TIMES DAILY  . vitamin E 1000 UNIT capsule Take 1,000 Units by mouth daily.  Marland Kitchen zinc gluconate 50 MG tablet Take 50 mg by mouth daily.   No facility-administered encounter medications on file as of 10/09/2018.     Allergy: No Known Allergies  Social Hx:   Social History   Socioeconomic History  . Marital status: Single    Spouse name: Not on file  . Number of children: Not on file  . Years of education: Not on file  . Highest education level: Not on file  Occupational History  . Not on file  Social Needs  .  Financial resource strain: Not on file  . Food insecurity    Worry: Not on file    Inability: Not on file  . Transportation needs    Medical: Not on file    Non-medical: Not on file  Tobacco Use  . Smoking status: Never Smoker  . Smokeless tobacco: Never Used  Substance and Sexual Activity  . Alcohol use: No  . Drug use: No  . Sexual activity: Not Currently    Partners: Male    Birth control/protection: None  Lifestyle  . Physical activity    Days per week: Not on file    Minutes per session: Not on file  . Stress: Not on file  Relationships  . Social Herbalist on phone: Not on file    Gets together: Not on file    Attends religious service: Not on file    Active member of club or organization: Not on file    Attends meetings of clubs or organizations: Not on file    Relationship status: Not on file  . Intimate partner violence    Fear of current or ex partner: Not on file    Emotionally abused: Not on file    Physically abused: Not on file  Forced sexual activity: Not on file  Other Topics Concern  . Not on file  Social History Narrative  . Not on file    Past Surgical Hx:  Past Surgical History:  Procedure Laterality Date  . IR IMAGING GUIDED PORT INSERTION  11/08/2017  . IR REMOVAL TUN ACCESS W/ PORT W/O FL MOD SED  04/19/2018  . LAPAROTOMY N/A 10/15/2017   Procedure: MINI LAPAROTOMY;  Surgeon: Everitt Amber, MD;  Location: [facility];  Service: Gynecology;  Laterality: N/A;  . MYOMECTOMY    . ROBOTIC ASSISTED TOTAL HYSTERECTOMY WITH BILATERAL SALPINGO OOPHERECTOMY Bilateral 10/15/2017   Procedure: XI ROBOTIC ASSISTED TOTAL HYSTERECTOMY GREATER THAN 250 GRAMS WITH BILATERAL SALPINGO OOPHORECTOMY, SENTINAL LYMPH NODE BIOPSY , PERI AND PARA- [place] LYMPH NODE DISECTIOIN;  Surgeon: Everitt Amber, MD;  Location: [facility];  Service: Gynecology;  Laterality: Bilateral;    Past Medical Hx:  Past Medical History:  Diagnosis Date  . Asthma   . Diabetes mellitus  without complication ([place])   . Fibroids   . Hypertension     Past Gynecological History:  No history of abnormal paps (last normal pap was 2017). G0, history of fibroids. No LMP recorded. Patient has had a hysterectomy.  Family Hx:  Family History  Problem Relation Age of Onset  . Alzheimer's disease Mother   . Liver cancer Father   . Cancer Maternal Aunt     Review of Systems:  Constitutional  Feels well,    ENT Normal appearing ears and nares bilaterally Skin/Breast  No rash, sores, jaundice, itching, dryness Cardiovascular  No chest pain, shortness of breath, or edema  Pulmonary  No cough or wheeze.  Gastro Intestinal  No nausea, vomitting, or diarrhoea. No bright red blood per rectum, no abdominal pain, change in bowel movement, or constipation.  Genito Urinary  No frequency, urgency, dysuria, no bleeding Musculo Skeletal  No myalgia, arthralgia, joint swelling or pain  Neurologic  No weakness, numbness, change in gait,  Psychology  No depression, anxiety, insomnia.   Vitals:  Blood pressure 128/73, pulse 79, temperature 98.3 F (36.8 C), temperature source Oral, resp. rate 18, height 5\' 4"  (1.626 m), weight 251 lb 9.6 oz (114.1 kg).  Physical Exam: WD in NAD Neck  Supple NROM, without any enlargements.  Lymph Node Survey No cervical supraclavicular or inguinal adenopathy Cardiovascular  Pulse normal rate, regularity and rhythm. S1 and S2 normal.  Lungs  Clear to auscultation bilateraly, without wheezes/crackles/rhonchi. Good air movement.  Skin  No rash/lesions/breakdown  Psychiatry  Alert and oriented to person, place, and time  Abdomen  Normoactive bowel sounds, abdomen soft, non-tender and obese without evidence of hernia.  Incisions soft Back No CVA tenderness Genito Urinary  Vaginal cuff smooth, no lesions, no blood.  Rectal: deferred  Extremities  No bilateral cyanosis, clubbing or edema.   Thereasa Solo, MD  10/09/2018, 2:30 PM

## 2018-10-09 NOTE — Patient Instructions (Signed)
Please notify Dr Denman George at phone number [phone] if you notice vaginal bleeding, new pelvic or abdominal pains, bloating, feeling full easy, or a change in bladder or bowel function.   Please contact Dr Serita Grit office (at [phone]) in September (or have Dr Clabe Seal office do this) to request an appointment with her for December, 2020.

## 2018-10-11 ENCOUNTER — Telehealth: Payer: Self-pay | Admitting: *Deleted

## 2018-10-11 NOTE — Telephone Encounter (Signed)
Returned the patient's call and explained that the December schedule is not out yet. Explained that I would call her once the schedule is out.

## 2018-12-26 ENCOUNTER — Telehealth: Payer: Self-pay | Admitting: *Deleted

## 2018-12-26 NOTE — Telephone Encounter (Signed)
Called and spoke with the patient, scheduled appt for December

## 2019-01-16 ENCOUNTER — Ambulatory Visit
Admission: RE | Admit: 2019-01-16 | Discharge: 2019-01-16 | Disposition: A | Discharge status: Home / Self Care | Payer: BLUE CROSS/BLUE SHIELD | Source: Ambulatory Visit | Attending: Radiation Oncology | Admitting: Radiation Oncology

## 2019-01-16 ENCOUNTER — Encounter: Payer: Self-pay | Admitting: Radiation Oncology

## 2019-01-16 ENCOUNTER — Other Ambulatory Visit: Payer: Self-pay

## 2019-01-16 VITALS — BP 122/66 | HR 93 | Temp 98.3°F | Resp 18 | Wt 255.2 lb

## 2019-01-16 DIAGNOSIS — Z923 Personal history of irradiation: Secondary | ICD-10-CM | POA: Diagnosis not present

## 2019-01-16 DIAGNOSIS — Z79899 Other long term (current) drug therapy: Secondary | ICD-10-CM | POA: Insufficient documentation

## 2019-01-16 DIAGNOSIS — Z8542 Personal history of malignant neoplasm of other parts of uterus: Secondary | ICD-10-CM | POA: Insufficient documentation

## 2019-01-16 DIAGNOSIS — C541 Malignant neoplasm of endometrium: Secondary | ICD-10-CM

## 2019-01-16 NOTE — Progress Notes (Signed)
Patient in for follow up denies any issues or complaints of at this time. Denies discharge itching or burning in pelvic area. States she is getting some bursitis in her shoulder.

## 2019-01-16 NOTE — Progress Notes (Signed)
Radiation Oncology         [phone]) [phone] ________________________________  Name: Morgan Vincent MRN: [id number]  Date: 01/16/2019  DOB: 08/02/1955  Follow-Up Visit Note  CC: [name], MD  Everitt Amber, MD    ICD-10-CM   1. Endometrial cancer (HCC)  C54.1     Diagnosis:   63 y.o. female with Stage IIIC1 (pT1a, pN1a, cM0) invasive endometrioid adenocarcinoma, grade 2, with LVSI  Interval Since Last Radiation:  1 year  12/12/17 - 01/10/18: Vaginal Cuff (HDR) / 30 Gy in 5 fractions of 6 Gy  Narrative:  The patient returns today for routine follow-up.  She was last seen by Dr. Denman George on 10/09/2018.  On review of systems, the patient reports that she is doing well overall. She denies any GU or GI issues.  She has been inconsistent with using her vaginal dilator.  She denies any significant problems with bloating.  ALLERGIES:  has No Known Allergies.  Meds: Current Outpatient Medications  Medication Sig Dispense Refill  . acetaminophen (TYLENOL) 325 MG tablet Take 650 mg by mouth every 6 (six) hours as needed for mild pain.    Marland Kitchen albuterol (PROVENTIL HFA;VENTOLIN HFA) 108 (90 Base) MCG/ACT inhaler Inhale 2 puffs into the lungs every 6 (six) hours as needed for wheezing or shortness of breath.    . Ascorbic Acid (VITAMIN C) 1000 MG tablet Take 1,000 mg by mouth daily.    . Black Currant Seed Oil 500 MG CAPS Take by mouth.    Marland Kitchen CALCIUM PO Take 1 tablet by mouth daily.    Marland Kitchen dexamethasone (DECADRON) 4 MG tablet Take 3 tabs at the night before and 3 tabs the morning of chemotherapy, every 3 weeks, by mouth 36 tablet 0  . hydrochlorothiazide (HYDRODIURIL) 50 MG tablet Take 50 mg by mouth daily    . meclizine (ANTIVERT) 25 MG tablet     . naproxen (NAPROSYN) 500 MG tablet     . Potassium 99 MG TABS Take by mouth.    . potassium chloride SA (KLOR-CON M20) 20 MEQ tablet Take 1 tablet (20 mEq total) by mouth daily. 30 tablet 0  . PROBIOTIC PRODUCT PO Take 1 capsule by mouth daily.     Marland Kitchen  pyridoxine (B-6) 100 MG tablet Take by mouth.    . Spacer/Aero-Holding Chambers (OPTICHAMBER DIAMOND) MISC USE WITH INHALER UP TO 4 TIMES DAILY    . vitamin E 1000 UNIT capsule Take 1,000 Units by mouth daily.    Marland Kitchen zinc gluconate 50 MG tablet Take 50 mg by mouth daily.     No current facility-administered medications for this encounter.     Physical Findings: The patient is in no acute distress. Patient is alert and oriented.  weight is 255 lb 3.2 oz (115.8 kg). Her temperature is 98.3 F (36.8 C). Her blood pressure is 122/66 and her pulse is 93. Her respiration is 18 and oxygen saturation is 100%.   Lungs are clear to auscultation bilaterally. Heart has regular rate and rhythm. No palpable cervical, supraclavicular, or axillary adenopathy. Abdomen soft, non-tender, normal bowel sounds.  On pelvic examination the external genitalia were unremarkable. A speculum exam was performed. There are no mucosal lesions noted in the vaginal vault. On bimanual examination there were no pelvic masses appreciated.  Lab Findings: Lab Results  Component Value Date   WBC 5.5 04/19/2018   HGB 9.7 (L) 04/19/2018   HCT 30.5 (L) 04/19/2018   MCV 104.1 (H) 04/19/2018   PLT 284  04/19/2018    Radiographic Findings: No results found.  Impression:  Stage IIIC1 (pT1a, pN1a, cM0) invasive endometrioid adenocarcinoma, grade 2, with LVSI.  No evidence of recurrence on clinical exam. I encouraged the patient to use her vaginal dilator on a more regular basis. She is currently only using it once a week.   Plan:  Patient will follow up with Dr. Denman George in 3 months and radiation oncology in 6 months.  ____________________________________  Blair Promise, PhD, MD  This document serves as a record of services personally performed by Gery Pray, MD. It was created on his behalf by Rae Lips, a trained medical scribe. The creation of this record is based on the scribe's personal observations and the  provider's statements to them. This document has been checked and approved by the attending provider.

## 2019-01-16 NOTE — Patient Instructions (Signed)
Coronavirus (COVID-19) Are you at risk?  Are you at risk for the Coronavirus (COVID-19)?  To be considered HIGH RISK for Coronavirus (COVID-19), you have to meet the following criteria:  . Traveled to [place], [place], [place], [place] or [place]; or in the [place] to [place], [place], [place], or [place]; and have fever, cough, and shortness of breath within the last 2 weeks of travel OR . Been in close contact with a person diagnosed with COVID-19 within the last 2 weeks and have fever, cough, and shortness of breath . IF YOU DO NOT MEET THESE CRITERIA, YOU ARE CONSIDERED LOW RISK FOR COVID-19.  What to do if you are HIGH RISK for COVID-19?  . If you are having a medical emergency, call 911. . Seek medical care right away. Before you go to a doctor's office, urgent care or emergency department, call ahead and tell them about your recent travel, contact with someone diagnosed with COVID-19, and your symptoms. You should receive instructions from your physician's office regarding next steps of care.  . When you arrive at healthcare provider, tell the healthcare staff immediately you have returned from visiting [place], [place], [place], [place] or [place]; or traveled in the [place] to [place], [place], [place], or [place]; in the last two weeks or you have been in close contact with a person diagnosed with COVID-19 in the last 2 weeks.   . Tell the health care staff about your symptoms: fever, cough and shortness of breath. . After you have been seen by a medical provider, you will be either: o Tested for (COVID-19) and discharged home on quarantine except to seek medical care if symptoms worsen, and asked to  - Stay home and avoid contact with others until you get your results (4-5 days)  - Avoid travel on public transportation if possible (such as bus, train, or airplane) or o Sent to the Emergency Department by EMS for evaluation, COVID-19 testing, and possible  admission depending on your condition and test results.  What to do if you are LOW RISK for COVID-19?  Reduce your risk of any infection by using the same precautions used for avoiding the common cold or flu:  . Wash your hands often with soap and warm water for at least 20 seconds.  If soap and water are not readily available, use an alcohol-based hand sanitizer with at least 60% alcohol.  . If coughing or sneezing, cover your mouth and nose by coughing or sneezing into the elbow areas of your shirt or coat, into a tissue or into your sleeve (not your hands). . Avoid shaking hands with others and consider head nods or verbal greetings only. . Avoid touching your eyes, nose, or mouth with unwashed hands.  . Avoid close contact with people who are sick. . Avoid places or events with large numbers of people in one location, like concerts or sporting events. . Carefully consider travel plans you have or are making. . If you are planning any travel outside or inside the [place], visit the CDC's Travelers' Health webpage for the latest health notices. . If you have some symptoms but not all symptoms, continue to monitor at home and seek medical attention if your symptoms worsen. . If you are having a medical emergency, call 911.   ADDITIONAL HEALTHCARE OPTIONS FOR PATIENTS  [facility] / e-Visit: [email or web address]         [facility]: [phone]  [facility]: [phone]                   [facility]: [phone]   

## 2019-04-03 ENCOUNTER — Inpatient Hospital Stay: Payer: BLUE CROSS/BLUE SHIELD | Attending: Gynecologic Oncology | Admitting: Gynecologic Oncology

## 2019-04-03 ENCOUNTER — Encounter: Payer: Self-pay | Admitting: Gynecologic Oncology

## 2019-04-03 ENCOUNTER — Other Ambulatory Visit: Payer: Self-pay

## 2019-04-03 VITALS — BP 130/66 | HR 90 | Temp 98.2°F | Resp 17 | Ht 64.0 in | Wt 256.8 lb

## 2019-04-03 DIAGNOSIS — J45909 Unspecified asthma, uncomplicated: Secondary | ICD-10-CM | POA: Insufficient documentation

## 2019-04-03 DIAGNOSIS — Z923 Personal history of irradiation: Secondary | ICD-10-CM | POA: Insufficient documentation

## 2019-04-03 DIAGNOSIS — Z79899 Other long term (current) drug therapy: Secondary | ICD-10-CM | POA: Insufficient documentation

## 2019-04-03 DIAGNOSIS — I10 Essential (primary) hypertension: Secondary | ICD-10-CM | POA: Insufficient documentation

## 2019-04-03 DIAGNOSIS — Z9221 Personal history of antineoplastic chemotherapy: Secondary | ICD-10-CM | POA: Insufficient documentation

## 2019-04-03 DIAGNOSIS — C541 Malignant neoplasm of endometrium: Secondary | ICD-10-CM | POA: Diagnosis present

## 2019-04-03 DIAGNOSIS — Z791 Long term (current) use of non-steroidal anti-inflammatories (NSAID): Secondary | ICD-10-CM | POA: Diagnosis not present

## 2019-04-03 DIAGNOSIS — Z90710 Acquired absence of both cervix and uterus: Secondary | ICD-10-CM | POA: Insufficient documentation

## 2019-04-03 DIAGNOSIS — Z90722 Acquired absence of ovaries, bilateral: Secondary | ICD-10-CM | POA: Diagnosis not present

## 2019-04-03 DIAGNOSIS — E119 Type 2 diabetes mellitus without complications: Secondary | ICD-10-CM | POA: Insufficient documentation

## 2019-04-03 DIAGNOSIS — E669 Obesity, unspecified: Secondary | ICD-10-CM | POA: Insufficient documentation

## 2019-04-03 NOTE — Patient Instructions (Signed)
Please notify Dr Denman George at phone number [phone] if you notice vaginal bleeding, new pelvic or abdominal pains, bloating, feeling full easy, or a change in bladder or bowel function.   Please return to see Dr Sondra Come in 51months and Dr Denman George in 6 months.

## 2019-04-03 NOTE — Progress Notes (Signed)
Follow-up Note: Gyn-Onc  Consult was requested by Dr. Raphael Gibney for the evaluation of Morgan Vincent 63 y.o. female  CC:  Chief Complaint  Patient presents with  . Endometrial cancer [facility])    Assessment/Plan:  [name]  is a 63 y.o.  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  S/p 6 cycles carboplatin and paclitaxel and vaginal brachytherapy adjuvant therapy completed November, 2019.  I recommend continue q 3 monthly surveillance visits with symptom assessment, pelvic examination. Pap not indicated at these visits. She will return to see me in 6 months and Dr Sondra Come in 3 months.   HPI: Morgan Vincent is a 63 year old woman who is seen in consultation at the request of Dr Raphael Gibney for a high grade endometrial cancer.  Patient developed severe abdominal pains in the abdomen on Sep 18, 2017.  This became worse overnight and she presented to [facility] emergency room department on Sep 19, 2017.  Her pains were crampy and throughout the upper and lower abdomen.  A CT scan of the abdomen and pelvis was performed which revealed mild inflammation involving distal small bowel loops including the terminal ileum with no obstruction, diverticulosis without definite diverticulitis.  No abscess.  The uterus was markedly abnormal with large heterogeneous soft tissues within the endometrial cavity.  There are multiple calcified fibroids identified.  She was presumed to have a diagnosis of gastroenteritis and was discharged home with recommendation to follow-up with her OB/GYN provider for work-up of her abnormal uterine findings.  She saw Dr. Raphael Gibney on May 31st, 2019 and a Pap smear was obtained which revealed atypical glandular cells, HPV negative.  An endometrial biopsy was performed which revealed minute fragments of malignant neoplasm, favor high-grade carcinoma.  There is small amount of of tissue was too small for further  evaluation with immunohistochemistry.  I recommendation to correlate with clinical findings is suggested.  The patient is obese, she has a history of hypertension, and asthma.  She has had a prior laparoscopic fibroid surgery (possible myomectomy).  She is nulliparous.  She had no other abdominal surgeries.  Her last menstrual period was in her 64s and she has had no postmenopausal bleeding until she had a biopsy which diagnosed her endometrial cancer.  Her family history significant for father who died of liver cancer, a paternal uncle who died of cancer of unknown origin, and a maternal aunt who died of cancer of unknown origin.  The patient has been diagnosed with diabetes, however she is not treated with medication for this.  Her last colonoscopy was 2 3 years ago.  She had a normal Pap smear in 2017.  On 10/15/17 she underwent robotic assisted total hysterectomy >250gm, BSO, SLN biopsy and right pelvic and para-aortic lymphadenectomy. A minilaparotomy was necessary to delivery the 350gm specimen. Additional sutures were placed at the introitus for bleeding at that site due to manipulator placement.  Postop she did well though she developed left genitofemoral nerve dysfunction.  Final pathology revealed a 8.2cm grade 2 endometrioid endometrial cancer with deep myometrial invasion (outer half) and LVSI was present. The right obturator SLN was positive for macrometastatic disease, though the left pelvic and para-aortic lymphadenectomy specimens were free of disease. She was staged as IIIC1 grade 2 endometrioid endometrial adenocarcinoma and adjuvant carboplatin and paclitaxel chemotherapy x 6 cycles with vaginal brachytherapy was prescribed in accordance with NCCN guidelines.  Interval Hx:  She went on  to complete 6 cycles of carboplatin and paclitaxel chemotherapy between 10/2217 and 02/25/18, and vaginal brachytherapy (30Gy in 5 fractions x 6Gy completed in October, 2019).  She did well with therapy  with some neuropathy - persistent. Vulvovaginal dryness.   Today we discussed survivorship issues including sexual function, longstanding toxicities of chemotherapy such as neuropathy, ongoing surveillance schedule, and screening for non-GYN cancers. She will have a colonoscopy in August, 2021. We discussed the safety of hair dye.   Current Meds:  Outpatient Encounter Medications as of 04/03/2019  Medication Sig  . acetaminophen (TYLENOL) 325 MG tablet Take 650 mg by mouth every 6 (six) hours as needed for mild pain.  Marland Kitchen albuterol (PROVENTIL HFA;VENTOLIN HFA) 108 (90 Base) MCG/ACT inhaler Inhale 2 puffs into the lungs every 6 (six) hours as needed for wheezing or shortness of breath.  . Ascorbic Acid (VITAMIN C) 1000 MG tablet Take 1,000 mg by mouth daily.  . Black Currant Seed Oil 500 MG CAPS Take by mouth.  Marland Kitchen CALCIUM PO Take 1 tablet by mouth daily.  Marland Kitchen dexamethasone (DECADRON) 4 MG tablet Take 3 tabs at the night before and 3 tabs the morning of chemotherapy, every 3 weeks, by mouth  . hydrochlorothiazide (HYDRODIURIL) 50 MG tablet Take 50 mg by mouth daily  . meclizine (ANTIVERT) 25 MG tablet   . naproxen (NAPROSYN) 500 MG tablet   . Potassium 99 MG TABS Take by mouth.  . potassium chloride SA (KLOR-CON M20) 20 MEQ tablet Take 1 tablet (20 mEq total) by mouth daily.  Marland Kitchen PROBIOTIC PRODUCT PO Take 1 capsule by mouth daily.   Marland Kitchen pyridoxine (B-6) 100 MG tablet Take by mouth.  . Spacer/Aero-Holding Chambers (OPTICHAMBER DIAMOND) MISC USE WITH INHALER UP TO 4 TIMES DAILY  . vitamin E 1000 UNIT capsule Take 1,000 Units by mouth daily.  Marland Kitchen zinc gluconate 50 MG tablet Take 50 mg by mouth daily.   No facility-administered encounter medications on file as of 04/03/2019.    Allergy: No Known Allergies  Social Hx:   Social History   Socioeconomic History  . Marital status: Single    Spouse name: Not on file  . Number of children: Not on file  . Years of education: Not on file  . Highest  education level: Not on file  Occupational History  . Not on file  Tobacco Use  . Smoking status: Never Smoker  . Smokeless tobacco: Never Used  Substance and Sexual Activity  . Alcohol use: No  . Drug use: No  . Sexual activity: Not Currently    Partners: Male    Birth control/protection: None  Other Topics Concern  . Not on file  Social History Narrative  . Not on file   Social Determinants of Health   Financial Resource Strain:   . Difficulty of Paying Living Expenses: Not on file  Food Insecurity:   . Worried About Charity fundraiser in the Last Year: Not on file  . Ran Out of Food in the Last Year: Not on file  Transportation Needs:   . Lack of Transportation (Medical): Not on file  . Lack of Transportation (Non-Medical): Not on file  Physical Activity:   . Days of Exercise per Week: Not on file  . Minutes of Exercise per Session: Not on file  Stress:   . Feeling of Stress : Not on file  Social Connections:   . Frequency of Communication with Friends and Family: Not on file  . Frequency of Social  Gatherings with Friends and Family: Not on file  . Attends Religious Services: Not on file  . Active Member of Clubs or Organizations: Not on file  . Attends Archivist Meetings: Not on file  . Marital Status: Not on file  Intimate Partner Violence:   . Fear of Current or Ex-Partner: Not on file  . Emotionally Abused: Not on file  . Physically Abused: Not on file  . Sexually Abused: Not on file    Past Surgical Hx:  Past Surgical History:  Procedure Laterality Date  . IR IMAGING GUIDED PORT INSERTION  11/08/2017  . IR REMOVAL TUN ACCESS W/ PORT W/O FL MOD SED  04/19/2018  . LAPAROTOMY N/A 10/15/2017   Procedure: MINI LAPAROTOMY;  Surgeon: Everitt Amber, MD;  Location: [facility];  Service: Gynecology;  Laterality: N/A;  . MYOMECTOMY    . ROBOTIC ASSISTED TOTAL HYSTERECTOMY WITH BILATERAL SALPINGO OOPHERECTOMY Bilateral 10/15/2017   Procedure: XI ROBOTIC ASSISTED  TOTAL HYSTERECTOMY GREATER THAN 250 GRAMS WITH BILATERAL SALPINGO OOPHORECTOMY, SENTINAL LYMPH NODE BIOPSY , PERI AND PARA- [place] LYMPH NODE DISECTIOIN;  Surgeon: Everitt Amber, MD;  Location: [facility];  Service: Gynecology;  Laterality: Bilateral;    Past Medical Hx:  Past Medical History:  Diagnosis Date  . Asthma   . Diabetes mellitus without complication ([place])   . Fibroids   . Hypertension     Past Gynecological History:  No history of abnormal paps (last normal pap was 2017). G0, history of fibroids. No LMP recorded. Patient has had a hysterectomy.  Family Hx:  Family History  Problem Relation Age of Onset  . Alzheimer's disease Mother   . Liver cancer Father   . Cancer Maternal Aunt     Review of Systems:  Constitutional  Feels well,    ENT Normal appearing ears and nares bilaterally Skin/Breast  No rash, sores, jaundice, itching, dryness Cardiovascular  No chest pain, shortness of breath, or edema  Pulmonary  No cough or wheeze.  Gastro Intestinal  No nausea, vomitting, or diarrhoea. No bright red blood per rectum, no abdominal pain, change in bowel movement, or constipation.  Genito Urinary  No frequency, urgency, dysuria, no bleeding Musculo Skeletal  No myalgia, arthralgia, joint swelling or pain  Neurologic  No weakness, numbness, change in gait,  Psychology  No depression, anxiety, insomnia.   Vitals:  Blood pressure 130/66, pulse 90, temperature 98.2 F (36.8 C), temperature source Temporal, resp. rate 17, height 5\' 4"  (1.626 m), weight 256 lb 12.8 oz (116.5 kg), SpO2 100 %.  Physical Exam: WD in NAD Neck  Supple NROM, without any enlargements.  Lymph Node Survey No cervical supraclavicular or inguinal adenopathy Cardiovascular  Pulse normal rate, regularity and rhythm. S1 and S2 normal.  Lungs  Clear to auscultation bilateraly, without wheezes/crackles/rhonchi. Good air movement.  Skin  No rash/lesions/breakdown  Psychiatry  Alert and oriented  to person, place, and time  Abdomen  Normoactive bowel sounds, abdomen soft, non-tender and obese without evidence of hernia.  Incisions soft Back No CVA tenderness Genito Urinary  Vaginal cuff smooth, no lesions, no blood.  Rectal: deferred  Extremities  No bilateral cyanosis, clubbing or edema.   Thereasa Solo, MD  04/03/2019, 2:57 PM

## 2019-04-21 ENCOUNTER — Telehealth: Payer: Self-pay | Admitting: Oncology

## 2019-04-21 ENCOUNTER — Telehealth: Payer: Self-pay | Admitting: *Deleted

## 2019-04-21 NOTE — Telephone Encounter (Signed)
Frances called and asked to schedule an appointment with Dr. Sondra Come in July.  Advised her that Dr. Clabe Seal office will call her back with an appointment.

## 2019-04-21 NOTE — Telephone Encounter (Signed)
CALLED PATIENT TO SCHEDULE FU APPT. FOR THIS PATIENT, PATIENT WANTS AN AFTERNOON TIME, PATIENT AGREED TO 10-23-19 @ 4 PM

## 2019-04-28 ENCOUNTER — Telehealth: Payer: Self-pay

## 2019-04-28 NOTE — Telephone Encounter (Signed)
Morgan Vincent states that she began with light spotting on 04-22-19. Yesterday there was fresh blood when she wiped after using the bath room and some drops in the commode. Due to insurance coverage for the next 6 months she cannot be seen by physicians out of the [place] network. Reviewed records in care everywhere with [facility].  [name] suggested asking her PCP to do a vaginal check.  They could refer her to GYN if further evaluation is needed.  Sent Dr. Serita Grit note from visit 04-03-19 to Dr. Koleen Nimrod and PA  [name].  The vaginal exam showed cuff smooth, no lesions, and no blood at that time. Pt verbalized understanding.

## 2019-05-27 ENCOUNTER — Telehealth: Payer: Self-pay | Admitting: *Deleted

## 2019-05-27 NOTE — Telephone Encounter (Signed)
Per request faxed path results from 10/15/2017 to Dr Clenton Pare office

## 2019-10-01 ENCOUNTER — Telehealth: Payer: Self-pay

## 2019-10-01 NOTE — Telephone Encounter (Signed)
Ms Noori called back to cancell her appointment with Dr. Denman George on Friday 10-03-19.

## 2019-10-01 NOTE — Telephone Encounter (Signed)
Morgan Vincent will continue care with [place] due to INS coverage. Drs. Denman George and [place] appointments cancelled.

## 2019-10-01 NOTE — Telephone Encounter (Signed)
LM stating that it was noted that she was being seen by a provider with [facility] for recurrent endometrial.  We are sorry for the recurrence. This office was aware of insurance issues for coverage at this office.  Requested a return call to verify continuing with current provider and cancelling of up coming appointment with Dr. Denman George on 10-03-19.

## 2019-10-03 ENCOUNTER — Inpatient Hospital Stay: Payer: BLUE CROSS/BLUE SHIELD | Admitting: Gynecologic Oncology

## 2019-10-23 ENCOUNTER — Ambulatory Visit: Payer: Self-pay | Admitting: Radiation Oncology

## 2019-12-15 IMAGING — XA IR FLUORO GUIDE CV LINE*L*
1 series · 1 of 1 positions shown · non-contrast
Comparison: none

INDICATION: 61-year-old female with a history of endometrial carcinoma, referred
for port catheter

[Series 1: fl (-) angio · 1 of 1 slices shown]
[im 1/1]
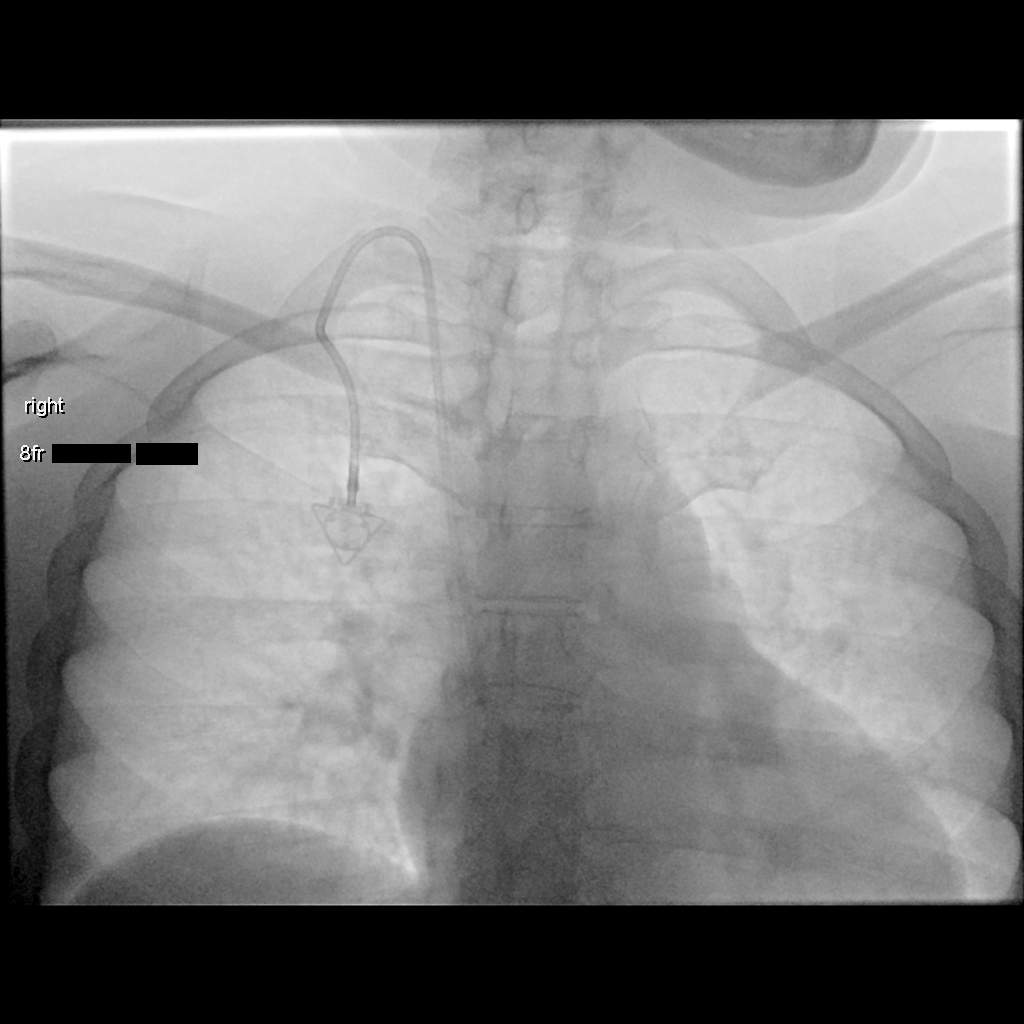

[1 of 1 positions shown; findings below may reference images not displayed]

EXAM:
IMPLANTED PORT A CATH PLACEMENT WITH ULTRASOUND AND FLUOROSCOPIC
GUIDANCE

MEDICATIONS:
2 g Ancef; The antibiotic was administered within an appropriate
time interval prior to skin puncture.

ANESTHESIA/SEDATION:
Moderate (conscious) sedation was employed during this procedure. A
total of Versed 1.0 mg and Fentanyl 50 mcg was administered
intravenously.

Moderate Sedation Time: 20 minutes. The patient's level of
consciousness and vital signs were monitored continuously by
radiology nursing throughout the procedure under my direct
supervision.

FLUOROSCOPY TIME:  0 minutes, 6 seconds (1 mGy)

COMPLICATIONS:
None

PROCEDURE:
The procedure, risks, benefits, and alternatives were explained to
the patient. Questions regarding the procedure were encouraged and
answered. The patient understands and consents to the procedure.

Ultrasound survey was performed with images stored and sent to PACs.

The right neck and chest was prepped with chlorhexidine, and draped
in the usual sterile fashion using maximum barrier technique (cap
and mask, sterile gown, sterile gloves, large sterile sheet, hand
hygiene and cutaneous antiseptic). Antibiotic prophylaxis was
provided with 2.0g Ancef administered IV one hour prior to skin
incision. Local anesthesia was attained by infiltration with 1%
lidocaine without epinephrine.

Ultrasound demonstrated patency of the right internal jugular vein,
and this was documented with an image. Under real-time ultrasound
guidance, this vein was accessed with a 21 gauge micropuncture
needle and image documentation was performed. A small dermatotomy
was made at the access site with an 11 scalpel. A 0.018" wire was
advanced into the SVC and used to estimate the length of the
internal catheter. The access needle exchanged for a 4F
micropuncture vascular sheath. The 0.018" wire was then removed and
a 0.035" wire advanced into the IVC.



The venous access site was then serially dilated and a peel away
vascular sheath placed over the wire. The wire was removed and the
port catheter advanced into position under fluoroscopic guidance.
The catheter tip is positioned in the cavoatrial junction. This was
documented with a spot image. The portacatheter was then tested and
found to flush and aspirate well. The port was flushed with saline
followed by 100 units/mL heparinized saline.

The pocket was then closed in two layers using first subdermal
inverted interrupted absorbable sutures followed by a running
subcuticular suture. The epidermis was then sealed with Dermabond.
The dermatotomy at the venous access site was also seal with
Dermabond.

Patient tolerated the procedure well and remained hemodynamically
stable throughout.

No complications encountered and no significant blood loss
encountered
IMPRESSION: Status post right IJ port catheter placement. Catheter ready for
use.

## 2020-05-25 DEATH — deceased
